# Patient Record
Sex: Female | Born: 1970
Health system: Southern US, Community
[De-identification: ages and names within clinical notes are randomized; demographics above are authoritative.]

## PROBLEM LIST (undated history)

## (undated) DIAGNOSIS — I10 Essential (primary) hypertension: Secondary | ICD-10-CM

## (undated) DIAGNOSIS — I7300 Raynaud's syndrome without gangrene: Secondary | ICD-10-CM

## (undated) DIAGNOSIS — E785 Hyperlipidemia, unspecified: Secondary | ICD-10-CM

## (undated) DIAGNOSIS — E079 Disorder of thyroid, unspecified: Secondary | ICD-10-CM

## (undated) HISTORY — DX: Raynaud's syndrome without gangrene: I73.00

## (undated) HISTORY — PX: MYOMECTOMY: SHX85

## (undated) HISTORY — DX: Hyperlipidemia, unspecified: E78.5

## (undated) HISTORY — DX: Essential (primary) hypertension: I10

## (undated) HISTORY — DX: Disorder of thyroid, unspecified: E07.9

---

## 1998-04-23 ENCOUNTER — Encounter: Admission: RE | Admit: 1998-04-23 | Discharge: 1998-07-22 | Payer: Self-pay | Admitting: *Deleted

## 1998-05-29 ENCOUNTER — Other Ambulatory Visit: Admission: RE | Admit: 1998-05-29 | Discharge: 1998-05-29 | Payer: Self-pay | Admitting: Obstetrics and Gynecology

## 1998-06-12 ENCOUNTER — Other Ambulatory Visit: Admission: RE | Admit: 1998-06-12 | Discharge: 1998-06-12 | Payer: Self-pay | Admitting: Obstetrics & Gynecology

## 1998-06-19 ENCOUNTER — Inpatient Hospital Stay (HOSPITAL_COMMUNITY): Admission: AD | Admit: 1998-06-19 | Discharge: 1998-06-21 | Payer: Self-pay | Admitting: *Deleted

## 1998-12-13 ENCOUNTER — Other Ambulatory Visit: Admission: RE | Admit: 1998-12-13 | Discharge: 1998-12-13 | Payer: Self-pay | Admitting: *Deleted

## 1999-12-11 ENCOUNTER — Other Ambulatory Visit: Admission: RE | Admit: 1999-12-11 | Discharge: 1999-12-11 | Payer: Self-pay | Admitting: *Deleted

## 2000-07-23 ENCOUNTER — Other Ambulatory Visit: Admission: RE | Admit: 2000-07-23 | Discharge: 2000-07-23 | Payer: Self-pay | Admitting: Obstetrics & Gynecology

## 2000-08-24 ENCOUNTER — Inpatient Hospital Stay (HOSPITAL_COMMUNITY): Admission: AD | Admit: 2000-08-24 | Discharge: 2000-08-24 | Payer: Self-pay | Admitting: Obstetrics and Gynecology

## 2000-08-24 ENCOUNTER — Encounter: Payer: Self-pay | Admitting: Obstetrics and Gynecology

## 2000-09-07 ENCOUNTER — Ambulatory Visit (HOSPITAL_COMMUNITY): Admission: RE | Admit: 2000-09-07 | Discharge: 2000-09-07 | Payer: Self-pay | Admitting: *Deleted

## 2000-09-07 ENCOUNTER — Encounter: Payer: Self-pay | Admitting: *Deleted

## 2000-09-24 ENCOUNTER — Encounter: Payer: Self-pay | Admitting: *Deleted

## 2000-09-24 ENCOUNTER — Ambulatory Visit (HOSPITAL_COMMUNITY): Admission: RE | Admit: 2000-09-24 | Discharge: 2000-09-24 | Payer: Self-pay | Admitting: *Deleted

## 2000-11-11 ENCOUNTER — Ambulatory Visit (HOSPITAL_COMMUNITY): Admission: RE | Admit: 2000-11-11 | Discharge: 2000-11-11 | Payer: Self-pay | Admitting: *Deleted

## 2000-11-11 ENCOUNTER — Encounter: Payer: Self-pay | Admitting: *Deleted

## 2001-09-29 ENCOUNTER — Other Ambulatory Visit: Admission: RE | Admit: 2001-09-29 | Discharge: 2001-09-30 | Payer: Self-pay | Admitting: *Deleted

## 2002-10-04 ENCOUNTER — Other Ambulatory Visit: Admission: RE | Admit: 2002-10-04 | Discharge: 2002-10-04 | Payer: Self-pay | Admitting: *Deleted

## 2003-12-25 ENCOUNTER — Other Ambulatory Visit: Admission: RE | Admit: 2003-12-25 | Discharge: 2003-12-25 | Payer: Self-pay | Admitting: Family Medicine

## 2004-05-08 ENCOUNTER — Encounter: Admission: RE | Admit: 2004-05-08 | Discharge: 2004-05-08 | Payer: Self-pay | Admitting: Family Medicine

## 2004-06-20 ENCOUNTER — Emergency Department (HOSPITAL_COMMUNITY): Admission: EM | Admit: 2004-06-20 | Discharge: 2004-06-20 | Payer: Self-pay | Admitting: Emergency Medicine

## 2004-10-13 ENCOUNTER — Ambulatory Visit: Payer: Self-pay | Admitting: Family Medicine

## 2005-05-04 ENCOUNTER — Other Ambulatory Visit: Admission: RE | Admit: 2005-05-04 | Discharge: 2005-05-04 | Payer: Self-pay | Admitting: Obstetrics & Gynecology

## 2005-07-08 ENCOUNTER — Ambulatory Visit: Payer: Self-pay | Admitting: Family Medicine

## 2006-11-30 ENCOUNTER — Ambulatory Visit: Payer: Self-pay | Admitting: Family Medicine

## 2006-11-30 LAB — CONVERTED CEMR LAB
TSH: 2.17 microintl units/mL (ref 0.35–5.50)
Total CK: 46 units/L (ref 7–177)

## 2008-04-26 ENCOUNTER — Ambulatory Visit: Payer: Self-pay | Admitting: Family Medicine

## 2008-04-27 LAB — CONVERTED CEMR LAB: TSH: 1.72 microintl units/mL (ref 0.35–5.50)

## 2009-07-17 ENCOUNTER — Ambulatory Visit: Payer: Self-pay | Admitting: Family Medicine

## 2009-07-17 DIAGNOSIS — R141 Gas pain: Secondary | ICD-10-CM | POA: Insufficient documentation

## 2009-07-17 DIAGNOSIS — R109 Unspecified abdominal pain: Secondary | ICD-10-CM | POA: Insufficient documentation

## 2009-07-17 DIAGNOSIS — R143 Flatulence: Secondary | ICD-10-CM

## 2009-07-17 DIAGNOSIS — R142 Eructation: Secondary | ICD-10-CM

## 2009-07-17 DIAGNOSIS — K219 Gastro-esophageal reflux disease without esophagitis: Secondary | ICD-10-CM

## 2009-07-18 ENCOUNTER — Telehealth (INDEPENDENT_AMBULATORY_CARE_PROVIDER_SITE_OTHER): Payer: Self-pay | Admitting: *Deleted

## 2009-07-18 LAB — CONVERTED CEMR LAB
ALT: 11 units/L (ref 0–35)
AST: 18 units/L (ref 0–37)
Albumin: 4.3 g/dL (ref 3.5–5.2)
Alkaline Phosphatase: 45 units/L (ref 39–117)
Amylase: 59 units/L (ref 27–131)
BUN: 9 mg/dL (ref 6–23)
Basophils Absolute: 0 10*3/uL (ref 0.0–0.1)
Basophils Relative: 0.1 % (ref 0.0–3.0)
Bilirubin, Direct: 0 mg/dL (ref 0.0–0.3)
CO2: 28 meq/L (ref 19–32)
Calcium: 9 mg/dL (ref 8.4–10.5)
Chloride: 104 meq/L (ref 96–112)
Creatinine, Ser: 0.8 mg/dL (ref 0.4–1.2)
Eosinophils Absolute: 0 10*3/uL (ref 0.0–0.7)
Eosinophils Relative: 0.5 % (ref 0.0–5.0)
GFR calc non Af Amer: 85.43 mL/min (ref >60)
Glucose, Bld: 71 mg/dL (ref 70–99)
HCT: 44.7 % (ref 36.0–46.0)
Hemoglobin: 14.9 g/dL (ref 12.0–15.0)
Lipase: 17 units/L (ref 11.0–59.0)
Lymphocytes Relative: 17 % (ref 12.0–46.0)
Lymphs Abs: 1.1 10*3/uL (ref 0.7–4.0)
MCHC: 33.4 g/dL (ref 30.0–36.0)
MCV: 92.9 fL (ref 78.0–100.0)
Monocytes Absolute: 0.4 10*3/uL (ref 0.1–1.0)
Monocytes Relative: 5.4 % (ref 3.0–12.0)
Neutro Abs: 5.2 10*3/uL (ref 1.4–7.7)
Neutrophils Relative %: 77 % (ref 43.0–77.0)
Platelets: 187 10*3/uL (ref 150.0–400.0)
Potassium: 4.4 meq/L (ref 3.5–5.1)
RBC: 4.81 M/uL (ref 3.87–5.11)
RDW: 12.4 % (ref 11.5–14.6)
Sed Rate: 3 mm/hr (ref 0–22)
Sodium: 139 meq/L (ref 135–145)
Total Bilirubin: 1.3 mg/dL — ABNORMAL HIGH (ref 0.3–1.2)
Total Protein: 7.3 g/dL (ref 6.0–8.3)
WBC: 6.7 10*3/uL (ref 4.5–10.5)

## 2009-08-09 ENCOUNTER — Ambulatory Visit: Payer: Self-pay | Admitting: Family Medicine

## 2009-08-09 DIAGNOSIS — E039 Hypothyroidism, unspecified: Secondary | ICD-10-CM | POA: Insufficient documentation

## 2009-08-12 LAB — CONVERTED CEMR LAB: TSH: 1.399 microintl units/mL (ref 0.350–4.500)

## 2010-02-28 ENCOUNTER — Encounter: Payer: Self-pay | Admitting: Family Medicine

## 2010-07-10 ENCOUNTER — Ambulatory Visit: Payer: Self-pay | Admitting: Family Medicine

## 2010-07-10 DIAGNOSIS — I10 Essential (primary) hypertension: Secondary | ICD-10-CM

## 2010-07-10 DIAGNOSIS — H43399 Other vitreous opacities, unspecified eye: Secondary | ICD-10-CM | POA: Insufficient documentation

## 2010-08-06 ENCOUNTER — Telehealth (INDEPENDENT_AMBULATORY_CARE_PROVIDER_SITE_OTHER): Payer: Self-pay | Admitting: *Deleted

## 2010-08-11 ENCOUNTER — Ambulatory Visit: Payer: Self-pay | Admitting: Family Medicine

## 2010-08-12 LAB — CONVERTED CEMR LAB
ALT: 18 units/L (ref 0–35)
AST: 20 units/L (ref 0–37)
Alkaline Phosphatase: 44 units/L (ref 39–117)
Bilirubin, Direct: 0.2 mg/dL (ref 0.0–0.3)
CO2: 29 meq/L (ref 19–32)
Chloride: 101 meq/L (ref 96–112)
Creatinine, Ser: 0.8 mg/dL (ref 0.4–1.2)
LDL Cholesterol: 71 mg/dL (ref 0–99)
Potassium: 4 meq/L (ref 3.5–5.1)
Sodium: 139 meq/L (ref 135–145)
Total Bilirubin: 0.8 mg/dL (ref 0.3–1.2)
Total CHOL/HDL Ratio: 2
Total Protein: 7 g/dL (ref 6.0–8.3)
Triglycerides: 25 mg/dL (ref 0.0–149.0)

## 2010-12-14 LAB — CONVERTED CEMR LAB
T3, Free: 2.8 pg/mL (ref 2.3–4.2)
TSH: 2.19 microintl units/mL (ref 0.35–5.50)

## 2010-12-18 NOTE — Assessment & Plan Note (Signed)
Summary: headaches/bp has been high/kn   Vital Signs:  Patient profile:   40 year old female Weight:      121 pounds Temp:     98.7 degrees F oral Pulse rate:   60 / minute Pulse rhythm:   regular BP sitting:   150 / 94  (left arm)  Vitals Entered By: Almeta Monas CMA Duncan Dull) (July 10, 2010 3:50 PM) CC: c/o elevated BP, seeing floaters and headaches x1wk  Vision Screening:Left eye w/o correction: 20 / 20 Right Eye w/o correction: 20 / 20 Both eyes w/o correction:  20/ 20       Vision Comments: per pt it is a little blurry  Vision Entered By: Almeta Monas CMA Duncan Dull) (July 10, 2010 4:23 PM)   History of Present Illness: Pt here c/o elevated bp for about a week with headache and has had floaters in both eyes on occassion.  NO CP, NO SOB.   Current Medications (verified): 1)  Levothyroxine Sodium 100 Mcg  Tabs (Levothyroxine Sodium) .... Take One Tablet Daily**labs Due Now** 2)  Camila 0.35 Mg Tabs (Norethindrone) .Marland Kitchen.. 1 By Mouth Qd 3)  Lisinopril-Hydrochlorothiazide 10-12.5 Mg Tabs (Lisinopril-Hydrochlorothiazide) .Marland Kitchen.. 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 mother- IBS Family History of CAD Female 1st degree relative <60 Family History of CAD Female 1st degree relative <50 Family History High  cholesterol Family History Hypertension  Social History: Reviewed history from 07/17/2009 and no changes required. 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   Neck:  No deformities, masses, or tenderness noted. Lungs:  Normal respiratory effort, chest expands symmetrically. Lungs are clear to auscultation, no crackles or wheezes. Heart:  normal rate and no murmur.   Extremities:  No clubbing, cyanosis, edema, or deformity noted with normal full range of motion of all joints.   Skin:  Intact without suspicious lesions or rashes Cervical Nodes:  No lymphadenopathy noted Psych:  Cognition and judgment appear intact. Alert and cooperative with normal attention span and concentration. No apparent delusions, illusions, hallucinations   Impression & Recommendations:  Problem # 1:  HYPERTENSION, ESSENTIAL NOS (ICD-401.9) Assessment New  Her updated medication list for this problem includes:    Lisinopril-hydrochlorothiazide 10-12.5 Mg Tabs (Lisinopril-hydrochlorothiazide) .Marland Kitchen... 1 by mouth once daily  BP today: 150/94 Prior BP: 94/60 (08/09/2009)  Labs Reviewed: K+: 4.4 (07/17/2009) Creat: : 0.8 (07/17/2009)     Problem # 2:  EYE FLOATERS (EAV-409.81)  Orders: Ophthalmology Referral (Ophthalmology) Vision Screening (19147)  Problem # 3:  HYPOTHYROIDISM (ICD-244.9)  Her updated medication list for this problem includes:    Levothyroxine Sodium 100 Mcg Tabs (Levothyroxine sodium) .Marland Kitchen... Take one tablet daily**labs due now**  Orders: Venipuncture (82956) TLB-TSH (Thyroid  Stimulating Hormone) (84443-TSH) TLB-T3, Free (Triiodothyronine) (84481-T3FREE) TLB-T4 (Thyrox), Free [phone]) Specimen Handling (08657)  Labs Reviewed: TSH:  1.399 (08/09/2009)     Complete Medication List: 1)  Levothyroxine Sodium 100 Mcg Tabs (Levothyroxine sodium) .... Take one tablet daily**labs due now** 2)  Camila 0.35 Mg Tabs (Norethindrone) .Marland Kitchen.. 1 by mouth qd 3)  Lisinopril-hydrochlorothiazide 10-12.5 Mg Tabs (Lisinopril-hydrochlorothiazide) .Marland Kitchen.. 1 by mouth once daily  Patient Instructions: 1)  Please schedule a follow-up appointment in 2 weeks. --  for bp check and fasting labs  Prescriptions: LISINOPRIL-HYDROCHLOROTHIAZIDE 10-12.5 MG TABS (LISINOPRIL-HYDROCHLOROTHIAZIDE) 1 by mouth once daily  #30 x 2   Entered and Authorized by:   Loreen Freud DO   Signed by:   Loreen Freud DO on 07/10/2010   Method used:   [facility]        [facility] [phone]* (retail)       [address] [place], [place]  [address]       Ph: [phone] or [phone]       Fax: [phone]   RxID:   [id number]    EKG  Procedure date:  07/10/2010  Findings:      Sinus bradycardia with rate of:  57

## 2010-12-18 NOTE — Assessment & Plan Note (Signed)
Summary: BP CHECK AND FASTING LABS--SEE 8/25 O/V--NO LABS LISTED//SPH   Vital Signs:  Patient profile:   40 year old female Height:      62 inches Weight:      118.4 pounds BMI:     21.73 Temp:     98.5 degrees F oral Pulse rate:   64 / minute Pulse rhythm:   regular BP sitting:   152 / 84  (left arm)  Vitals Entered By: Almeta Monas CMA Duncan Dull) (August 11, 2010 9:58 AM) CC: BP check, wants to discuss meds   History of Present Illness: Pt here for bp check.  visual changes have resolved.  No complaints.  Current Medications (verified): 1)  Levothyroxine Sodium 100 Mcg  Tabs (Levothyroxine Sodium) .... Take One Tablet Daily 2)  Camila 0.35 Mg Tabs (Norethindrone) .Marland Kitchen.. 1 By Mouth Qd 3)  Lisinopril-Hydrochlorothiazide 20-25 Mg Tabs (Lisinopril-Hydrochlorothiazide) .Marland Kitchen.. 1 By Mouth Once Daily  Allergies (verified): No Known Drug Allergies  Past History:  Past medical, surgical, family and social histories (including risk factors) reviewed for relevance to current acute and chronic problems.  Past Medical History: Reviewed history from 07/17/2009 and no changes required. hypothyroid  Past Surgical History: Reviewed history from 07/17/2009 and no changes required. Csection x2  Family History: Reviewed history from 07/10/2010 and no changes required. mother- IBS Family History of CAD Female 1st degree relative <60 Family History of CAD Female 1st degree relative <50 Family History High cholesterol Family History Hypertension  Social History: Reviewed history from 07/17/2009 and no changes required. 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 Lungs are clear to auscultation, no crackles or wheezes. Heart:  normal rate and no murmur.     Extremities:  No clubbing, cyanosis, edema, or deformity noted with normal full range of motion of all joints.   Psych:  Cognition and judgment appear intact. Alert and cooperative with normal attention span and concentration. No apparent delusions, illusions, hallucinations   Impression & Recommendations:  Problem # 1:  HYPERTENSION, ESSENTIAL NOS (ICD-401.9)  Her updated medication list for this problem includes:    Lisinopril-hydrochlorothiazide 20-25 Mg Tabs (Lisinopril-hydrochlorothiazide) .Marland Kitchen... 1 by mouth once daily  Orders: Venipuncture (04540) TLB-Lipid Panel (80061-LIPID) TLB-BMP (Basic Metabolic Panel-BMET) (80048-METABOL) TLB-Hepatic/Liver Function Pnl (80076-HEPATIC)  BP today: 152/84 Prior BP: 150/94 (07/10/2010)  Labs Reviewed: K+: 4.4 (07/17/2009) Creat: : 0.8 (07/17/2009)     Problem # 2:  HYPOTHYROIDISM (ICD-244.9)  Her updated medication list for this problem includes:    Levothyroxine Sodium 100 Mcg Tabs (Levothyroxine sodium) .Marland Kitchen... Take one tablet daily  Labs Reviewed: TSH: 2.19 (07/10/2010)     Complete Medication List: 1)  Levothyroxine Sodium 100 Mcg Tabs (Levothyroxine sodium) .... Take one tablet daily 2)  Camila 0.35 Mg Tabs (Norethindrone) .Marland Kitchen.. 1 by mouth qd 3)  Lisinopril-hydrochlorothiazide 20-25 Mg Tabs (Lisinopril-hydrochlorothiazide) .Marland Kitchen.. 1 by mouth once daily  Patient Instructions: 1)  Please schedule a follow-up appointment in 3 months .  Prescriptions: LEVOTHYROXINE SODIUM 100 MCG  TABS (LEVOTHYROXINE SODIUM) Take one tablet daily  #90 x 3   Entered and Authorized by:   Loreen Freud DO   Signed by:   Loreen Freud DO on 08/11/2010   Method used:   Electronically to        [facility] [phone]* (retail)  [address]       [place], [place]  [address]       Ph: [phone]       Fax: [phone]   RxID:   [id number] LISINOPRIL-HYDROCHLOROTHIAZIDE 20-25 MG TABS (LISINOPRIL-HYDROCHLOROTHIAZIDE) 1 by mouth once daily   #90 x 3   Entered and Authorized by:   Loreen Freud DO   Signed by:   Loreen Freud DO on 08/11/2010   Method used:   Electronically to        [facility] [phone]* (retail)       [address]       [place], [place]  [address]       Ph: [phone]       Fax: [phone]   RxID:   [id number] LISINOPRIL-HYDROCHLOROTHIAZIDE 20-25 MG TABS (LISINOPRIL-HYDROCHLOROTHIAZIDE) 1 by mouth once daily  #90 x 3   Entered and Authorized by:   Loreen Freud DO   Signed by:   Loreen Freud DO on 08/11/2010   Method used:   Electronically to        [facility] [phone]* (retail)       [address]       [place], [place]  [address]       Ph: [phone]       Fax: [phone]   RxID:   [id number]   Appended Document: BP CHECK AND FASTING LABS--SEE 8/25 O/V--NO LABS LISTED//SPH

## 2010-12-18 NOTE — Progress Notes (Signed)
Summary: NEED LAB ORDERS  Phone Note Call from Patient   Summary of Call: ORDERS AT BOTTOM OF 8/25 OFFICE VISIT STATE:  Please schedule a follow-up appointment in 2 weeks. --  for bp check and fasting labs   BUT NO LABS ARE LISTED--PATIENT HAS APPT FOR [facility] 9/26 AT 9:45---WHAT LABS DOES SHE NEED?? Initial call taken by: Jerolyn Shin,  August 06, 2010 1:33 PM  Follow-up for Phone Call        If pt has BP check appt the docotor or assistant will put order in after BP is checked. No need to request order prior to appt just make sure appt states BP check, and labs...............Marland KitchenFelecia Deloach CMA  August 06, 2010 1:42 PM   Additional Follow-up for Phone Call Additional follow up Details #1::        RECEIVED, WILL MAKE NOTE Additional Follow-up by: Jerolyn Shin,  August 06, 2010 3:05 PM

## 2010-12-18 NOTE — Letter (Signed)
[facility]   Im[facility] By: Lanelle Bal 03/06/2010 11:05:54  _____________________________________________________________________  External Attachment:    Type:   Image     Comment:   External Document

## 2011-11-23 ENCOUNTER — Ambulatory Visit (INDEPENDENT_AMBULATORY_CARE_PROVIDER_SITE_OTHER): Payer: BC Managed Care – PPO | Admitting: Family Medicine

## 2011-11-23 ENCOUNTER — Encounter: Payer: Self-pay | Admitting: Family Medicine

## 2011-11-23 VITALS — BP 116/60 | HR 76 | Temp 99.0°F | Wt 121.2 lb

## 2011-11-23 DIAGNOSIS — I10 Essential (primary) hypertension: Secondary | ICD-10-CM

## 2011-11-23 DIAGNOSIS — E039 Hypothyroidism, unspecified: Secondary | ICD-10-CM

## 2011-11-23 LAB — BASIC METABOLIC PANEL
BUN: 10 mg/dL (ref 6–23)
Creatinine, Ser: 0.8 mg/dL (ref 0.4–1.2)
GFR: 86.89 mL/min (ref >60.00)
Glucose, Bld: 93 mg/dL (ref 70–99)
Potassium: 3.9 mEq/L (ref 3.5–5.1)

## 2011-11-23 LAB — LIPID PANEL
Cholesterol: 156 mg/dL (ref 0–200)
Triglycerides: 24 mg/dL (ref 0.0–149.0)

## 2011-11-23 MED ORDER — LISINOPRIL 10 MG PO TABS: 10.0000 mg | ORAL_TABLET | Freq: Every day | ORAL | Status: DC

## 2011-11-23 MED ORDER — LEVOTHYROXINE SODIUM 100 MCG PO TABS: 100.0000 ug | ORAL_TABLET | Freq: Every day | ORAL | Status: DC

## 2011-11-23 NOTE — Patient Instructions (Addendum)

## 2011-11-23 NOTE — Progress Notes (Signed)
  Subjective:    Patient here for follow-up of elevated blood pressure.  She is exercising and is adherent to a low-salt diet.  Blood pressure is well controlled at home. Cardiac symptoms: none. Patient denies: chest pain, chest pressure/discomfort, claudication, dyspnea, exertional chest pressure/discomfort, fatigue, irregular heart beat, lower extremity edema, near-syncope, orthopnea, palpitations, paroxysmal nocturnal dyspnea, syncope and tachypnea. Cardiovascular risk factors: family history of premature cardiovascular disease and hypertension. Use of agents associated with hypertension: none. History of target organ damage: none.  The following portions of the patient's history were reviewed and updated as appropriate: allergies, current medications, past family history, past medical history, past social history, past surgical history and problem list.  Review of Systems As above    Objective:    BP 116/60  Pulse 76  Temp(Src) 99 F (37.2 C) (Oral)  Wt 121 lb 3.2 oz (54.976 kg)  SpO2 98% General appearance: alert, cooperative, appears stated age and no distress Throat: lips, mucosa, and tongue normal; teeth and gums normal Neck: no adenopathy, no carotid bruit, supple, symmetrical, trachea midline and thyroid not enlarged, symmetric, no tenderness/mass/nodules Lungs: clear to auscultation bilaterally Heart: regular rate and rhythm, S1, S2 normal, no murmur, click, rub or gallop Extremities: extremities normal, atraumatic, no cyanosis or edema    Assessment:    Hypertension, normal blood pressure . Evidence of target organ damage: none.   hypothyroid---check labs Plan:    Medication: decrease to lisinopril 10 mg-- pt was only taking 1/2 lisinopril 20/25. Dietary sodium restriction. Regular aerobic exercise. Check blood pressures 2-3  times weekly and record. Follow up: 6 months and as needed.

## 2011-11-24 LAB — HEPATIC FUNCTION PANEL
ALT: 18 U/L (ref 0–35)
Albumin: 4.4 g/dL (ref 3.5–5.2)
Total Protein: 7.6 g/dL (ref 6.0–8.3)

## 2011-11-30 ENCOUNTER — Encounter: Payer: Self-pay | Admitting: Family Medicine

## 2012-12-31 ENCOUNTER — Other Ambulatory Visit: Payer: Self-pay

## 2013-02-27 ENCOUNTER — Other Ambulatory Visit: Payer: Self-pay | Admitting: Family Medicine

## 2013-03-06 ENCOUNTER — Ambulatory Visit: Payer: BC Managed Care – PPO | Admitting: Family Medicine

## 2013-03-20 ENCOUNTER — Ambulatory Visit (INDEPENDENT_AMBULATORY_CARE_PROVIDER_SITE_OTHER): Payer: BC Managed Care – PPO | Admitting: Family Medicine

## 2013-03-20 ENCOUNTER — Encounter: Payer: Self-pay | Admitting: Family Medicine

## 2013-03-20 VITALS — BP 114/68 | HR 68 | Temp 98.9°F | Wt 119.2 lb

## 2013-03-20 DIAGNOSIS — I10 Essential (primary) hypertension: Secondary | ICD-10-CM

## 2013-03-20 DIAGNOSIS — E039 Hypothyroidism, unspecified: Secondary | ICD-10-CM

## 2013-03-20 NOTE — Progress Notes (Signed)
  Subjective:    Patient here for follow-up of elevated blood pressure.  She is exercising and is adherent to a low-salt diet.  Blood pressure is well controlled at home. Cardiac symptoms: none. Patient denies: chest pain, chest pressure/discomfort, claudication, dyspnea, exertional chest pressure/discomfort, fatigue, irregular heart beat, lower extremity edema, near-syncope, orthopnea, palpitations, paroxysmal nocturnal dyspnea, syncope and tachypnea. Cardiovascular risk factors: family history of premature cardiovascular disease and hypertension. Use of agents associated with hypertension: none. History of target organ damage: none.  The following portions of the patient's history were reviewed and updated as appropriate: allergies, current medications, past family history, past medical history, past social history, past surgical history and problem list.  Review of Systems Pertinent items are noted in HPI.     Objective:    BP 114/68  Pulse 68  Temp(Src) 98.9 F (37.2 C) (Oral)  Wt 119 lb 3.2 oz (54.069 kg)  BMI 21.8 kg/m2  SpO2 99% General appearance: alert, cooperative, appears stated age and no distress Throat: lips, mucosa, and tongue normal; teeth and gums normal Neck: no adenopathy, supple, symmetrical, trachea midline and thyroid not enlarged, symmetric, no tenderness/mass/nodules Lungs: clear to auscultation bilaterally Heart: S1, S2 normal Extremities: extremities normal, atraumatic, no cyanosis or edema    Assessment:    Hypertension, normal blood pressure . Evidence of target organ damage: none.    Plan:    Medication: no change. Dietary sodium restriction. Regular aerobic exercise. Check blood pressures 2-3 times weekly and record. Follow up: 6 months and as needed.

## 2013-03-20 NOTE — Assessment & Plan Note (Signed)
Check labs Refill meds  

## 2013-03-20 NOTE — Patient Instructions (Signed)

## 2013-03-20 NOTE — Assessment & Plan Note (Signed)
Stable Con' meds 

## 2013-03-21 LAB — LIPID PANEL
HDL: 61.5 mg/dL (ref >39.00)
Total CHOL/HDL Ratio: 2
VLDL: 5.4 mg/dL (ref 0.0–40.0)

## 2013-03-21 LAB — HEPATIC FUNCTION PANEL
ALT: 29 U/L (ref 0–35)
Total Bilirubin: 0.9 mg/dL (ref 0.3–1.2)

## 2013-03-21 LAB — BASIC METABOLIC PANEL
BUN: 8 mg/dL (ref 6–23)
Chloride: 102 mEq/L (ref 96–112)
Glucose, Bld: 87 mg/dL (ref 70–99)
Potassium: 4.1 mEq/L (ref 3.5–5.1)

## 2013-03-21 LAB — TSH: TSH: 1.85 u[IU]/mL (ref 0.35–5.50)

## 2013-07-13 ENCOUNTER — Other Ambulatory Visit: Payer: Self-pay | Admitting: Family Medicine

## 2013-09-21 ENCOUNTER — Other Ambulatory Visit: Payer: Self-pay

## 2013-11-23 ENCOUNTER — Telehealth: Payer: Self-pay | Admitting: Family Medicine

## 2013-11-24 DIAGNOSIS — R011 Cardiac murmur, unspecified: Secondary | ICD-10-CM | POA: Insufficient documentation

## 2013-11-24 DIAGNOSIS — I493 Ventricular premature depolarization: Secondary | ICD-10-CM | POA: Insufficient documentation

## 2013-11-24 DIAGNOSIS — I10 Essential (primary) hypertension: Secondary | ICD-10-CM | POA: Insufficient documentation

## 2013-11-27 ENCOUNTER — Ambulatory Visit: Payer: BC Managed Care – PPO | Admitting: Family Medicine

## 2014-01-25 ENCOUNTER — Encounter: Payer: Self-pay | Admitting: Family Medicine

## 2014-02-26 ENCOUNTER — Other Ambulatory Visit: Payer: Self-pay | Admitting: Family Medicine

## 2014-06-18 ENCOUNTER — Other Ambulatory Visit: Payer: Self-pay | Admitting: Family Medicine

## 2014-06-27 ENCOUNTER — Ambulatory Visit: Payer: BC Managed Care – PPO | Admitting: Family Medicine

## 2014-07-16 ENCOUNTER — Encounter: Payer: Self-pay | Admitting: Family Medicine

## 2014-07-16 ENCOUNTER — Ambulatory Visit (INDEPENDENT_AMBULATORY_CARE_PROVIDER_SITE_OTHER): Payer: BC Managed Care – PPO | Admitting: Family Medicine

## 2014-07-16 VITALS — BP 134/88 | HR 55 | Temp 99.2°F | Wt 126.3 lb

## 2014-07-16 DIAGNOSIS — E039 Hypothyroidism, unspecified: Secondary | ICD-10-CM

## 2014-07-16 DIAGNOSIS — I10 Essential (primary) hypertension: Secondary | ICD-10-CM

## 2014-07-16 MED ORDER — LISINOPRIL 10 MG PO TABS: ORAL_TABLET | ORAL | Status: DC

## 2014-07-16 MED ORDER — LEVOTHYROXINE SODIUM 100 MCG PO TABS: ORAL_TABLET | ORAL | Status: DC

## 2014-07-16 NOTE — Patient Instructions (Signed)

## 2014-07-16 NOTE — Progress Notes (Signed)
  Subjective:    Patient here for follow-up of elevated blood pressure.  She is exercising and is adherent to a low-salt diet.  Blood pressure is well controlled at home. Cardiac symptoms: none. Patient denies: chest pain, chest pressure/discomfort, claudication, dyspnea, exertional chest pressure/discomfort, fatigue, irregular heart beat, lower extremity edema, near-syncope, orthopnea, palpitations, paroxysmal nocturnal dyspnea, syncope and tachypnea. Cardiovascular risk factors: hypertension. Use of agents associated with hypertension: none. History of target organ damage: none.  The following portions of the patient's history were reviewed and updated as appropriate: allergies, current medications, past family history, past medical history, past social history, past surgical history and problem list.  Review of Systems Pertinent items are noted in HPI.     Objective:    BP 134/88  Pulse 55  Temp(Src) 99.2 F (37.3 C) (Oral)  Wt 126 lb 5.2 oz (57.3 kg)  SpO2 98%  LMP 07/02/2014 General appearance: alert, cooperative, appears stated age and no distress Throat: lips, mucosa, and tongue normal; teeth and gums normal Neck: no adenopathy, no carotid bruit, no JVD, supple, symmetrical, trachea midline and thyroid not enlarged, symmetric, no tenderness/mass/nodules Lungs: clear to auscultation bilaterally Heart: S1, S2 normal Extremities: extremities normal, atraumatic, no cyanosis or edema    Assessment:    Hypertension, slightly elevated today. Evidence of target organ damage: none.    Plan:    Medication: no change. Dietary sodium restriction. Regular aerobic exercise. Check blood pressures 2-3 times weekly and record. Follow up: 6 months and as needed.   1. Essential hypertension  - lisinopril (PRINIVIL,ZESTRIL) 10 MG tablet; TAKE ONE TABLET BY MOUTH ONE TIME DAILY  Dispense: 90 tablet; Refill: 1  2. Unspecified hypothyroidism  - levothyroxine (SYNTHROID, LEVOTHROID) 100  MCG tablet; TAKE ONE TABLET BY MOUTH ONE TIME DAILY  Dispense: 90 tablet; Refill: 1

## 2014-07-16 NOTE — Progress Notes (Signed)
Pre visit review using our clinic review tool, if applicable. No additional management support is needed unless otherwise documented below in the visit note. 

## 2015-01-15 ENCOUNTER — Ambulatory Visit: Payer: BC Managed Care – PPO | Admitting: Family Medicine

## 2015-01-21 ENCOUNTER — Ambulatory Visit: Payer: Self-pay | Admitting: Family Medicine

## 2015-01-31 ENCOUNTER — Ambulatory Visit: Payer: Self-pay | Admitting: Family Medicine

## 2015-02-19 ENCOUNTER — Ambulatory Visit (INDEPENDENT_AMBULATORY_CARE_PROVIDER_SITE_OTHER): Payer: BLUE CROSS/BLUE SHIELD | Admitting: Family Medicine

## 2015-02-19 ENCOUNTER — Encounter: Payer: Self-pay | Admitting: Family Medicine

## 2015-02-19 VITALS — BP 110/80 | HR 72 | Temp 99.0°F | Ht 62.0 in | Wt 131.6 lb

## 2015-02-19 DIAGNOSIS — E039 Hypothyroidism, unspecified: Secondary | ICD-10-CM

## 2015-02-19 DIAGNOSIS — I10 Essential (primary) hypertension: Secondary | ICD-10-CM

## 2015-02-19 DIAGNOSIS — I7300 Raynaud's syndrome without gangrene: Secondary | ICD-10-CM | POA: Diagnosis not present

## 2015-02-19 LAB — CBC WITH DIFFERENTIAL/PLATELET
BASOS PCT: 0.4 % (ref 0.0–3.0)
Basophils Absolute: 0 10*3/uL (ref 0.0–0.1)
Eosinophils Absolute: 0.1 10*3/uL (ref 0.0–0.7)
Eosinophils Relative: 1 % (ref 0.0–5.0)
HCT: 42.3 % (ref 36.0–46.0)
Hemoglobin: 14.2 g/dL (ref 12.0–15.0)
LYMPHS PCT: 24.1 % (ref 12.0–46.0)
Lymphs Abs: 1.3 10*3/uL (ref 0.7–4.0)
MCHC: 33.6 g/dL (ref 30.0–36.0)
MCV: 90.6 fl (ref 78.0–100.0)
Monocytes Absolute: 0.3 10*3/uL (ref 0.1–1.0)
Monocytes Relative: 6.3 % (ref 3.0–12.0)
NEUTROS PCT: 68.2 % (ref 43.0–77.0)
Neutro Abs: 3.8 10*3/uL (ref 1.4–7.7)
Platelets: 253 10*3/uL (ref 150.0–400.0)
RBC: 4.67 Mil/uL (ref 3.87–5.11)
RDW: 13.3 % (ref 11.5–15.5)
WBC: 5.5 10*3/uL (ref 4.0–10.5)

## 2015-02-19 LAB — LIPID PANEL
CHOL/HDL RATIO: 3
Cholesterol: 149 mg/dL (ref 0–200)
HDL: 59.4 mg/dL (ref >39.00)
LDL Cholesterol: 83 mg/dL (ref 0–99)
NonHDL: 89.6
TRIGLYCERIDES: 35 mg/dL (ref 0.0–149.0)
VLDL: 7 mg/dL (ref 0.0–40.0)

## 2015-02-19 LAB — BASIC METABOLIC PANEL
BUN: 8 mg/dL (ref 6–23)
CHLORIDE: 102 meq/L (ref 96–112)
CO2: 28 meq/L (ref 19–32)
CREATININE: 0.83 mg/dL (ref 0.40–1.20)
Calcium: 9.3 mg/dL (ref 8.4–10.5)
GFR: 79.61 mL/min (ref >60.00)
Glucose, Bld: 86 mg/dL (ref 70–99)
Potassium: 4.1 mEq/L (ref 3.5–5.1)
Sodium: 135 mEq/L (ref 135–145)

## 2015-02-19 LAB — HEPATIC FUNCTION PANEL
ALBUMIN: 4.2 g/dL (ref 3.5–5.2)
ALK PHOS: 55 U/L (ref 39–117)
ALT: 21 U/L (ref 0–35)
AST: 22 U/L (ref 0–37)
Bilirubin, Direct: 0.1 mg/dL (ref 0.0–0.3)
TOTAL PROTEIN: 7.5 g/dL (ref 6.0–8.3)
Total Bilirubin: 0.6 mg/dL (ref 0.2–1.2)

## 2015-02-19 LAB — TSH: TSH: 3.12 u[IU]/mL (ref 0.35–4.50)

## 2015-02-19 MED ORDER — LISINOPRIL 10 MG PO TABS: ORAL_TABLET | ORAL | Status: DC

## 2015-02-19 NOTE — Progress Notes (Signed)
Subjective:    Patient ID: Megan Escobar, female    DOB: January 16, 1971, 44 y.o.   MRN: [id number]  HPI  Patient here for f/u bp and thyroid.  Pt also states she has a hx of raynauds and it has been worsening over the last few years.    Past Medical History  Diagnosis Date  . Thyroid disease     Hypothyroid    Review of Systems  Constitutional: Negative for activity change, appetite change, fatigue and unexpected weight change.  Respiratory: Negative for cough and shortness of breath.   Cardiovascular: Negative for chest pain and palpitations.  Psychiatric/Behavioral: Negative for behavioral problems and dysphoric mood. The patient is not nervous/anxious.     Current Outpatient Prescriptions on File Prior to Visit  Medication Sig Dispense Refill  . CAMILA 0.35 MG tablet Take 1 tablet by mouth daily.     Marland Kitchen levothyroxine (SYNTHROID, LEVOTHROID) 100 MCG tablet TAKE ONE TABLET BY MOUTH ONE TIME DAILY 90 tablet 1   No current facility-administered medications on file prior to visit.       Objective:    Physical Exam  Constitutional: She is oriented to person, place, and time. She appears well-developed and well-nourished. No distress.  HENT:  Right Ear: External ear normal.  Left Ear: External ear normal.  Nose: Nose normal.  Mouth/Throat: Oropharynx is clear and moist.  Eyes: EOM are normal. Pupils are equal, round, and reactive to light.  Neck: Normal range of motion. Neck supple.  Cardiovascular: Normal rate, regular rhythm and normal heart sounds.   No murmur heard. Pulmonary/Chest: Effort normal and breath sounds normal. No respiratory distress. She has no wheezes. She has no rales. She exhibits no tenderness.  Neurological: She is alert and oriented to person, place, and time.  Psychiatric: She has a normal mood and affect. Her behavior is normal. Judgment and thought content normal.    BP 110/80 mmHg  Pulse 72  Temp(Src) 99 F (37.2 C) (Oral)  Ht 5\' 2"  (1.575  m)  Wt 131 lb 9.6 oz (59.693 kg)  BMI 24.06 kg/m2  SpO2 96%  LMP 02/05/2015 Wt Readings from Last 3 Encounters:  02/19/15 131 lb 9.6 oz (59.693 kg)  07/16/14 126 lb 5.2 oz (57.3 kg)  03/20/13 119 lb 3.2 oz (54.069 kg)     Lab Results  Component Value Date   WBC 6.7 07/17/2009   HGB 14.9 07/17/2009   HCT 44.7 07/17/2009   PLT 187.0 07/17/2009   GLUCOSE 87 03/20/2013   CHOL 136 03/20/2013   TRIG 27.0 03/20/2013   HDL 61.50 03/20/2013   LDLCALC 69 03/20/2013   ALT 29 03/20/2013   AST 24 03/20/2013   NA 136 03/20/2013   K 4.1 03/20/2013   CL 102 03/20/2013   CREATININE 0.8 03/20/2013   BUN 8 03/20/2013   CO2 27 03/20/2013   TSH 1.85 03/20/2013       Assessment & Plan:   Problem List Items Addressed This Visit    Raynaud's disease    Offered to change med to norvasc Pt said she would like to wait until winter to change it      Relevant Medications   lisinopril (PRINIVIL,ZESTRIL) tablet    Other Visit Diagnoses    Hypothyroidism, unspecified hypothyroidism type    -  Primary    Relevant Orders    TSH    Essential hypertension        Relevant Medications    lisinopril (PRINIVIL,ZESTRIL) tablet  Other Relevant Orders    Basic metabolic panel    CBC with Differential/Platelet    Hepatic function panel    Lipid panel    TSH       I am having Megan Escobar maintain her CAMILA, levothyroxine, and lisinopril.  Meds ordered this encounter  Medications  . lisinopril (PRINIVIL,ZESTRIL) 10 MG tablet    Sig: TAKE ONE TABLET BY MOUTH ONE TIME DAILY    Dispense:  90 tablet    Refill:  [place], DO

## 2015-02-19 NOTE — Progress Notes (Signed)
Pre visit review using our clinic review tool, if applicable. No additional management support is needed unless otherwise documented below in the visit note. 

## 2015-02-19 NOTE — Patient Instructions (Signed)

## 2015-02-19 NOTE — Assessment & Plan Note (Signed)
Offered to change med to norvasc Pt said she would like to wait until winter to change it

## 2015-04-25 ENCOUNTER — Telehealth: Payer: Self-pay | Admitting: Family Medicine

## 2015-04-25 ENCOUNTER — Other Ambulatory Visit: Payer: Self-pay | Admitting: Family Medicine

## 2015-04-25 DIAGNOSIS — I10 Essential (primary) hypertension: Secondary | ICD-10-CM

## 2015-04-25 DIAGNOSIS — E039 Hypothyroidism, unspecified: Secondary | ICD-10-CM

## 2015-04-25 MED ORDER — LISINOPRIL 10 MG PO TABS: ORAL_TABLET | ORAL | Status: DC

## 2015-04-25 MED ORDER — LEVOTHYROXINE SODIUM 100 MCG PO TABS: ORAL_TABLET | ORAL | Status: DC

## 2015-04-25 NOTE — Telephone Encounter (Signed)
Caller name: Cordelia Pen [facility] [place]. Can be reached: [phone]  Reason for call: Pt needing refill on levothyroxine (SYNTHROID, LEVOTHROID) 100 MCG tablet. Please send to pharmacy.

## 2015-04-25 NOTE — Telephone Encounter (Signed)
Rx faxed.    KP 

## 2015-08-22 ENCOUNTER — Ambulatory Visit: Payer: BLUE CROSS/BLUE SHIELD | Admitting: Family Medicine

## 2015-09-02 ENCOUNTER — Ambulatory Visit (INDEPENDENT_AMBULATORY_CARE_PROVIDER_SITE_OTHER): Payer: BLUE CROSS/BLUE SHIELD | Admitting: Family Medicine

## 2015-09-02 ENCOUNTER — Encounter: Payer: Self-pay | Admitting: Family Medicine

## 2015-09-02 VITALS — BP 112/76 | HR 70 | Temp 99.2°F | Wt 129.6 lb

## 2015-09-02 DIAGNOSIS — I7300 Raynaud's syndrome without gangrene: Secondary | ICD-10-CM | POA: Diagnosis not present

## 2015-09-02 DIAGNOSIS — I10 Essential (primary) hypertension: Secondary | ICD-10-CM

## 2015-09-02 DIAGNOSIS — E039 Hypothyroidism, unspecified: Secondary | ICD-10-CM | POA: Diagnosis not present

## 2015-09-02 MED ORDER — AMLODIPINE BESYLATE 5 MG PO TABS: 5.0000 mg | ORAL_TABLET | Freq: Every day | ORAL | Status: DC

## 2015-09-02 MED ORDER — LEVOTHYROXINE SODIUM 100 MCG PO TABS: ORAL_TABLET | ORAL | Status: DC

## 2015-09-02 NOTE — Progress Notes (Signed)
Pre visit review using our clinic review tool, if applicable. No additional management support is needed unless otherwise documented below in the visit note. 

## 2015-09-02 NOTE — Patient Instructions (Signed)

## 2015-09-02 NOTE — Assessment & Plan Note (Signed)
Check labs 

## 2015-09-02 NOTE — Progress Notes (Signed)
Patient ID: Megan Escobar, female    DOB: 1971/07/16  Age: 44 y.o. MRN: [id number]    Subjective:  Subjective HPI Megan Escobar presents for f/u raynauds and bp.  As weather cools down she will have more trouble with the raynauds and would like to switch her bp med.    We also need to check her thyroid.   Review of Systems  Constitutional: Negative for diaphoresis, appetite change, fatigue and unexpected weight change.  Eyes: Negative for pain, redness and visual disturbance.  Respiratory: Negative for cough, chest tightness, shortness of breath and wheezing.   Cardiovascular: Negative for chest pain, palpitations and leg swelling.  Endocrine: Negative for cold intolerance, heat intolerance, polydipsia, polyphagia and polyuria.  Genitourinary: Negative for dysuria, frequency and difficulty urinating.  Neurological: Negative for dizziness, light-headedness, numbness and headaches.  All other systems reviewed and are negative.   History Past Medical History  Diagnosis Date  . Thyroid disease     Hypothyroid    She has past surgical history that includes Cesarean section.   Her family history includes Coronary artery disease in her father and mother; Heart disease in her father; Heart disease (age of onset: 40) in her mother; Hyperlipidemia in her father and mother; Hypertension in her father and mother; Irritable bowel syndrome in her mother.She reports that she has never smoked. She has never used smokeless tobacco. She reports that she does not drink alcohol or use illicit drugs.  Current Outpatient Prescriptions on File Prior to Visit  Medication Sig Dispense Refill  . CAMILA 0.35 MG tablet Take 1 tablet by mouth daily.      No current facility-administered medications on file prior to visit.     Objective:  Objective Physical Exam  Constitutional: She is oriented to person, place, and time. She appears well-developed and well-nourished.  HENT:  Head: Normocephalic  and atraumatic.  Eyes: Conjunctivae and EOM are normal.  Neck: Normal range of motion. Neck supple. No JVD present. Carotid bruit is not present. No thyromegaly present.  Cardiovascular: Normal rate, regular rhythm and normal heart sounds.   No murmur heard. Pulmonary/Chest: Effort normal and breath sounds normal. No respiratory distress. She has no wheezes. She has no rales. She exhibits no tenderness.  Musculoskeletal: She exhibits no edema.  Neurological: She is alert and oriented to person, place, and time.  Psychiatric: She has a normal mood and affect.   BP 112/76 mmHg  Pulse 70  Temp(Src) 99.2 F (37.3 C) (Oral)  Wt 129 lb 9.6 oz (58.786 kg)  SpO2 98% Wt Readings from Last 3 Encounters:  09/02/15 129 lb 9.6 oz (58.786 kg)  02/19/15 131 lb 9.6 oz (59.693 kg)  07/16/14 126 lb 5.2 oz (57.3 kg)     Lab Results  Component Value Date   WBC 5.5 02/19/2015   HGB 14.2 02/19/2015   HCT 42.3 02/19/2015   PLT 253.0 02/19/2015   GLUCOSE 86 02/19/2015   CHOL 149 02/19/2015   TRIG 35.0 02/19/2015   HDL 59.40 02/19/2015   LDLCALC 83 02/19/2015   ALT 21 02/19/2015   AST 22 02/19/2015   NA 135 02/19/2015   K 4.1 02/19/2015   CL 102 02/19/2015   CREATININE 0.83 02/19/2015   BUN 8 02/19/2015   CO2 28 02/19/2015   TSH 3.12 02/19/2015    No results found.   Assessment & Plan:  Plan I have discontinued Megan Escobar's lisinopril. I am also having her start on amLODipine. Additionally, I am having  her maintain her CAMILA and levothyroxine.  Meds ordered this encounter  Medications  . levothyroxine (SYNTHROID, LEVOTHROID) 100 MCG tablet    Sig: TAKE ONE TABLET BY MOUTH ONE TIME DAILY    Dispense:  90 tablet    Refill:  1    D/C PREVIOUS SCRIPTS FOR THIS MEDICATION  . amLODipine (NORVASC) 5 MG tablet    Sig: Take 1 tablet (5 mg total) by mouth daily.    Dispense:  30 tablet    Refill:  2    Problem List Items Addressed This Visit    Raynaud's disease    Start norvasc  daily rto prn      Relevant Medications   amLODipine (NORVASC) 5 MG tablet   Hypothyroidism - Primary    Check labs      Relevant Medications   levothyroxine (SYNTHROID, LEVOTHROID) 100 MCG tablet   Essential hypertension    norvasc 5 mg daily rto 3 months or sooner prn      Relevant Medications   amLODipine (NORVASC) 5 MG tablet      Follow-up: Return in about 3 months (around 12/03/2015), or if symptoms worsen or fail to improve, for hypertension , raynauds.  Garnet Koyanagi, DO

## 2015-09-02 NOTE — Assessment & Plan Note (Signed)
norvasc 5 mg daily rto 3 months or sooner prn

## 2015-09-02 NOTE — Assessment & Plan Note (Signed)
Start norvasc daily rto prn

## 2015-12-03 ENCOUNTER — Ambulatory Visit: Payer: BLUE CROSS/BLUE SHIELD | Admitting: Family Medicine

## 2016-01-09 ENCOUNTER — Ambulatory Visit (INDEPENDENT_AMBULATORY_CARE_PROVIDER_SITE_OTHER): Payer: BLUE CROSS/BLUE SHIELD | Admitting: Family Medicine

## 2016-01-09 ENCOUNTER — Encounter: Payer: Self-pay | Admitting: Family Medicine

## 2016-01-09 VITALS — BP 120/66 | HR 70 | Temp 98.6°F | Wt 134.0 lb

## 2016-01-09 DIAGNOSIS — I10 Essential (primary) hypertension: Secondary | ICD-10-CM | POA: Diagnosis not present

## 2016-01-09 DIAGNOSIS — I7300 Raynaud's syndrome without gangrene: Secondary | ICD-10-CM | POA: Diagnosis not present

## 2016-01-09 MED ORDER — AMLODIPINE BESYLATE 2.5 MG PO TABS: 2.5000 mg | ORAL_TABLET | Freq: Every day | ORAL | Status: DC

## 2016-01-09 MED ORDER — LISINOPRIL 10 MG PO TABS: 10.0000 mg | ORAL_TABLET | Freq: Every day | ORAL | Status: DC

## 2016-01-09 NOTE — Assessment & Plan Note (Signed)
con't norvasc but will lower to 2. 5 and restart lisinopril for bp F/u prn

## 2016-01-09 NOTE — Progress Notes (Signed)
Patient ID: Megan Escobar, female    DOB: [birth year]  Age: 45 y.o. MRN: [id number]    Subjective:  Subjective HPI Megan Escobar presents for f/u bp.  It is running high at work 150-170/80s at work but is normal at home.  The lisinopril did not do that.  The norvasc is just for raynauds.    Review of Systems  Constitutional: Negative for diaphoresis, appetite change, fatigue and unexpected weight change.  Eyes: Negative for pain, redness and visual disturbance.  Respiratory: Negative for cough, chest tightness, shortness of breath and wheezing.   Cardiovascular: Negative for chest pain, palpitations and leg swelling.  Endocrine: Negative for cold intolerance, heat intolerance, polydipsia, polyphagia and polyuria.  Genitourinary: Negative for dysuria, frequency and difficulty urinating.  Neurological: Negative for dizziness, light-headedness, numbness and headaches.    History Past Medical History  Diagnosis Date  . Thyroid disease     Hypothyroid    She has past surgical history that includes Cesarean section.   Her family history includes Coronary artery disease in her father and mother; Heart disease in her father; Heart disease (age of onset: 62) in her mother; Hyperlipidemia in her father and mother; Hypertension in her father and mother; Irritable bowel syndrome in her mother.She reports that she has never smoked. She has never used smokeless tobacco. She reports that she does not drink alcohol or use illicit drugs.  Current Outpatient Prescriptions on File Prior to Visit  Medication Sig Dispense Refill  . CAMILA 0.35 MG tablet Take 1 tablet by mouth daily.     Marland Kitchen levothyroxine (SYNTHROID, LEVOTHROID) 100 MCG tablet TAKE ONE TABLET BY MOUTH ONE TIME DAILY 90 tablet 1   No current facility-administered medications on file prior to visit.     Objective:  Objective Physical Exam  Constitutional: She is oriented to person, place, and time. She appears well-developed and  well-nourished.  HENT:  Head: Normocephalic and atraumatic.  Eyes: Conjunctivae and EOM are normal.  Neck: Normal range of motion. Neck supple. No JVD present. Carotid bruit is not present. No thyromegaly present.  Cardiovascular: Normal rate, regular rhythm and normal heart sounds.   No murmur heard. Pulmonary/Chest: Effort normal and breath sounds normal. No respiratory distress. She has no wheezes. She has no rales. She exhibits no tenderness.  Musculoskeletal: She exhibits no edema.  Neurological: She is alert and oriented to person, place, and time.  Psychiatric: She has a normal mood and affect. Her behavior is normal. Judgment and thought content normal.  Nursing note and vitals reviewed.  BP 120/66 mmHg  Pulse 70  Temp(Src) 98.6 F (37 C) (Oral)  Wt 134 lb (60.782 kg)  SpO2 98% Wt Readings from Last 3 Encounters:  01/09/16 134 lb (60.782 kg)  09/02/15 129 lb 9.6 oz (58.786 kg)  02/19/15 131 lb 9.6 oz (59.693 kg)     Lab Results  Component Value Date   WBC 5.5 02/19/2015   HGB 14.2 02/19/2015   HCT 42.3 02/19/2015   PLT 253.0 02/19/2015   GLUCOSE 86 02/19/2015   CHOL 149 02/19/2015   TRIG 35.0 02/19/2015   HDL 59.40 02/19/2015   LDLCALC 83 02/19/2015   ALT 21 02/19/2015   AST 22 02/19/2015   NA 135 02/19/2015   K 4.1 02/19/2015   CL 102 02/19/2015   CREATININE 0.83 02/19/2015   BUN 8 02/19/2015   CO2 28 02/19/2015   TSH 3.12 02/19/2015    No results found.   Assessment & Plan:  Plan I have discontinued Ms. Erbes's amLODipine. I am also having her start on amLODipine and lisinopril. Additionally, I am having her maintain her CAMILA and levothyroxine.  Meds ordered this encounter  Medications  . amLODipine (NORVASC) 2.5 MG tablet    Sig: Take 1 tablet (2.5 mg total) by mouth daily.    Dispense:  90 tablet    Refill:  3  . lisinopril (PRINIVIL,ZESTRIL) 10 MG tablet    Sig: Take 1 tablet (10 mg total) by mouth daily.    Dispense:  90 tablet     Refill:  3    Problem List Items Addressed This Visit      Unprioritized   Raynaud's disease    con't norvasc but will lower to 2. 5 and restart lisinopril for bp F/u prn      Relevant Medications   amLODipine (NORVASC) 2.5 MG tablet   lisinopril (PRINIVIL,ZESTRIL) 10 MG tablet   Essential hypertension - Primary   Relevant Medications   amLODipine (NORVASC) 2.5 MG tablet   lisinopril (PRINIVIL,ZESTRIL) 10 MG tablet      Follow-up: Return in about 2 weeks (around 01/23/2016), or if symptoms worsen or fail to improve, for hypertension.  Garnet Koyanagi, DO

## 2016-01-09 NOTE — Progress Notes (Signed)
Pre visit review using our clinic review tool, if applicable. No additional management support is needed unless otherwise documented below in the visit note. 

## 2016-01-09 NOTE — Patient Instructions (Signed)
Hypertension Hypertension, commonly called high blood pressure, is when the force of blood pumping through your arteries is too strong. Your arteries are the blood vessels that carry blood from your heart throughout your body. A blood pressure reading consists of a higher number over a lower number, such as 110/72. The higher number (systolic) is the pressure inside your arteries when your heart pumps. The lower number (diastolic) is the pressure inside your arteries when your heart relaxes. Ideally you want your blood pressure below 120/80. Hypertension forces your heart to work harder to pump blood. Your arteries may become narrow or stiff. Having untreated or uncontrolled hypertension can cause heart attack, stroke, kidney disease, and other problems. RISK FACTORS Some risk factors for high blood pressure are controllable. Others are not.  Risk factors you cannot control include:   Race. You may be at higher risk if you are African American.  Age. Risk increases with age.  Gender. Men are at higher risk than women before age 45 years. After age 65, women are at higher risk than men. Risk factors you can control include:  Not getting enough exercise or physical activity.  Being overweight.  Getting too much fat, sugar, calories, or salt in your diet.  Drinking too much alcohol. SIGNS AND SYMPTOMS Hypertension does not usually cause signs or symptoms. Extremely high blood pressure (hypertensive crisis) may cause headache, anxiety, shortness of breath, and nosebleed. DIAGNOSIS To check if you have hypertension, your health care provider will measure your blood pressure while you are seated, with your arm held at the level of your heart. It should be measured at least twice using the same arm. Certain conditions can cause a difference in blood pressure between your right and left arms. A blood pressure reading that is higher than normal on one occasion does not mean that you need treatment. If  it is not clear whether you have high blood pressure, you may be asked to return on a different day to have your blood pressure checked again. Or, you may be asked to monitor your blood pressure at home for 1 or more weeks. TREATMENT Treating high blood pressure includes making lifestyle changes and possibly taking medicine. Living a healthy lifestyle can help lower high blood pressure. You may need to change some of your habits. Lifestyle changes may include:  Following the DASH diet. This diet is high in fruits, vegetables, and whole grains. It is low in salt, red meat, and added sugars.  Keep your sodium intake below 2,300 mg per day.  Getting at least 30-45 minutes of aerobic exercise at least 4 times per week.  Losing weight if necessary.  Not smoking.  Limiting alcoholic beverages.  Learning ways to reduce stress. Your health care provider may prescribe medicine if lifestyle changes are not enough to get your blood pressure under control, and if one of the following is true:  You are 18-59 years of age and your systolic blood pressure is above 140.  You are 60 years of age or older, and your systolic blood pressure is above 150.  Your diastolic blood pressure is above 90.  You have diabetes, and your systolic blood pressure is over 140 or your diastolic blood pressure is over 90.  You have kidney disease and your blood pressure is above 140/90.  You have heart disease and your blood pressure is above 140/90. Your personal target blood pressure may vary depending on your medical conditions, your age, and other factors. HOME CARE INSTRUCTIONS    Have your blood pressure rechecked as directed by your health care provider.   Take medicines only as directed by your health care provider. Follow the directions carefully. Blood pressure medicines must be taken as prescribed. The medicine does not work as well when you skip doses. Skipping doses also puts you at risk for  problems.  Do not smoke.   Monitor your blood pressure at home as directed by your health care provider. SEEK MEDICAL CARE IF:   You think you are having a reaction to medicines taken.  You have recurrent headaches or feel dizzy.  You have swelling in your ankles.  You have trouble with your vision. SEEK IMMEDIATE MEDICAL CARE IF:  You develop a severe headache or confusion.  You have unusual weakness, numbness, or feel faint.  You have severe chest or abdominal pain.  You vomit repeatedly.  You have trouble breathing. MAKE SURE YOU:   Understand these instructions.  Will watch your condition.  Will get help right away if you are not doing well or get worse.   This information is not intended to replace advice given to you by your health care provider. Make sure you discuss any questions you have with your health care provider.   Document Released: 11/02/2005 Document Revised: 03/19/2015 Document Reviewed: 08/25/2013 [employer] Interactive Patient Education 2016 [employer]  

## 2016-06-29 ENCOUNTER — Other Ambulatory Visit: Payer: Self-pay | Admitting: Family Medicine

## 2016-06-29 DIAGNOSIS — E039 Hypothyroidism, unspecified: Secondary | ICD-10-CM

## 2016-07-01 ENCOUNTER — Other Ambulatory Visit: Payer: Self-pay | Admitting: Family Medicine

## 2016-07-01 DIAGNOSIS — E039 Hypothyroidism, unspecified: Secondary | ICD-10-CM

## 2016-07-09 ENCOUNTER — Encounter: Payer: BLUE CROSS/BLUE SHIELD | Admitting: Family Medicine

## 2016-09-03 ENCOUNTER — Encounter: Payer: BLUE CROSS/BLUE SHIELD | Admitting: Family Medicine

## 2016-09-15 ENCOUNTER — Ambulatory Visit (INDEPENDENT_AMBULATORY_CARE_PROVIDER_SITE_OTHER): Payer: BLUE CROSS/BLUE SHIELD | Admitting: Family Medicine

## 2016-09-15 ENCOUNTER — Encounter: Payer: Self-pay | Admitting: Family Medicine

## 2016-09-15 VITALS — BP 118/68 | HR 55 | Temp 98.9°F | Resp 16 | Ht 62.0 in | Wt 133.4 lb

## 2016-09-15 DIAGNOSIS — Z0000 Encounter for general adult medical examination without abnormal findings: Secondary | ICD-10-CM

## 2016-09-15 DIAGNOSIS — Z8632 Personal history of gestational diabetes: Secondary | ICD-10-CM | POA: Diagnosis not present

## 2016-09-15 DIAGNOSIS — E785 Hyperlipidemia, unspecified: Secondary | ICD-10-CM | POA: Diagnosis not present

## 2016-09-15 DIAGNOSIS — I7300 Raynaud's syndrome without gangrene: Secondary | ICD-10-CM | POA: Diagnosis not present

## 2016-09-15 DIAGNOSIS — I10 Essential (primary) hypertension: Secondary | ICD-10-CM | POA: Diagnosis not present

## 2016-09-15 DIAGNOSIS — D229 Melanocytic nevi, unspecified: Secondary | ICD-10-CM

## 2016-09-15 LAB — COMPREHENSIVE METABOLIC PANEL
ALK PHOS: 61 U/L (ref 39–117)
ALT: 18 U/L (ref 0–35)
AST: 16 U/L (ref 0–37)
Albumin: 4.4 g/dL (ref 3.5–5.2)
BILIRUBIN TOTAL: 0.7 mg/dL (ref 0.2–1.2)
BUN: 7 mg/dL (ref 6–23)
CO2: 29 mEq/L (ref 19–32)
Calcium: 9.5 mg/dL (ref 8.4–10.5)
Chloride: 100 mEq/L (ref 96–112)
Creatinine, Ser: 0.82 mg/dL (ref 0.40–1.20)
GFR: 80.15 mL/min (ref >60.00)
GLUCOSE: 92 mg/dL (ref 70–99)
Potassium: 4.6 mEq/L (ref 3.5–5.1)
SODIUM: 136 meq/L (ref 135–145)
TOTAL PROTEIN: 7.7 g/dL (ref 6.0–8.3)

## 2016-09-15 LAB — CBC WITH DIFFERENTIAL/PLATELET
Basophils Absolute: 0 10*3/uL (ref 0.0–0.1)
Basophils Relative: 0.4 % (ref 0.0–3.0)
EOS ABS: 0 10*3/uL (ref 0.0–0.7)
EOS PCT: 0.5 % (ref 0.0–5.0)
HCT: 44.1 % (ref 36.0–46.0)
Hemoglobin: 14.9 g/dL (ref 12.0–15.0)
LYMPHS ABS: 1.9 10*3/uL (ref 0.7–4.0)
Lymphocytes Relative: 23.1 % (ref 12.0–46.0)
MCHC: 33.7 g/dL (ref 30.0–36.0)
MCV: 89.7 fl (ref 78.0–100.0)
MONO ABS: 0.5 10*3/uL (ref 0.1–1.0)
Monocytes Relative: 6.3 % (ref 3.0–12.0)
NEUTROS PCT: 69.7 % (ref 43.0–77.0)
Neutro Abs: 5.6 10*3/uL (ref 1.4–7.7)
Platelets: 273 10*3/uL (ref 150.0–400.0)
RBC: 4.92 Mil/uL (ref 3.87–5.11)
RDW: 12.8 % (ref 11.5–15.5)
WBC: 8 10*3/uL (ref 4.0–10.5)

## 2016-09-15 LAB — LIPID PANEL
CHOLESTEROL: 151 mg/dL (ref 0–200)
HDL: 64.9 mg/dL (ref >39.00)
LDL Cholesterol: 79 mg/dL (ref 0–99)
NonHDL: 86.17
TRIGLYCERIDES: 38 mg/dL (ref 0.0–149.0)
Total CHOL/HDL Ratio: 2
VLDL: 7.6 mg/dL (ref 0.0–40.0)

## 2016-09-15 LAB — HEMOGLOBIN A1C: Hgb A1c MFr Bld: 5.5 % (ref 4.6–6.5)

## 2016-09-15 LAB — TSH: TSH: 3.19 u[IU]/mL (ref 0.35–4.50)

## 2016-09-15 MED ORDER — AMLODIPINE BESYLATE 2.5 MG PO TABS: 2.5000 mg | ORAL_TABLET | Freq: Every day | ORAL | 3 refills | Status: DC

## 2016-09-15 NOTE — Patient Instructions (Signed)
Preventive Care for Adults, Female A healthy lifestyle and preventive care can promote health and wellness. Preventive health guidelines for women include the following key practices.  A routine yearly physical is a good way to check with your health care provider about your health and preventive screening. It is a chance to share any concerns and updates on your health and to receive a thorough exam.  Visit your dentist for a routine exam and preventive care every 6 months. Brush your teeth twice a day and floss once a day. Good oral hygiene prevents tooth decay and gum disease.  The frequency of eye exams is based on your age, health, family medical history, use of contact lenses, and other factors. Follow your health care provider's recommendations for frequency of eye exams.  Eat a healthy diet. Foods like vegetables, fruits, whole grains, low-fat dairy products, and lean protein foods contain the nutrients you need without too many calories. Decrease your intake of foods high in solid fats, added sugars, and salt. Eat the right amount of calories for you.Get information about a proper diet from your health care provider, if necessary.  Regular physical exercise is one of the most important things you can do for your health. Most adults should get at least 150 minutes of moderate-intensity exercise (any activity that increases your heart rate and causes you to sweat) each week. In addition, most adults need muscle-strengthening exercises on 2 or more days a week.  Maintain a healthy weight. The body mass index (BMI) is a screening tool to identify possible weight problems. It provides an estimate of body fat based on height and weight. Your health care provider can find your BMI and can help you achieve or maintain a healthy weight.For adults 20 years and older:  A BMI below 18.5 is considered underweight.  A BMI of 18.5 to 24.9 is normal.  A BMI of 25 to 29.9 is considered overweight.  A  BMI of 30 and above is considered obese.  Maintain normal blood lipids and cholesterol levels by exercising and minimizing your intake of saturated fat. Eat a balanced diet with plenty of fruit and vegetables. Blood tests for lipids and cholesterol should begin at age 45 and be repeated every 5 years. If your lipid or cholesterol levels are high, you are over 50, or you are at high risk for heart disease, you may need your cholesterol levels checked more frequently.Ongoing high lipid and cholesterol levels should be treated with medicines if diet and exercise are not working.  If you smoke, find out from your health care provider how to quit. If you do not use tobacco, do not start.  Lung cancer screening is recommended for adults aged 45-80 years who are at high risk for developing lung cancer because of a history of smoking. A yearly low-dose CT scan of the lungs is recommended for people who have at least a 30-pack-year history of smoking and are a current smoker or have quit within the past 15 years. A pack year of smoking is smoking an average of 1 pack of cigarettes a day for 1 year (for example: 1 pack a day for 30 years or 2 packs a day for 15 years). Yearly screening should continue until the smoker has stopped smoking for at least 15 years. Yearly screening should be stopped for people who develop a health problem that would prevent them from having lung cancer treatment.  If you are pregnant, do not drink alcohol. If you are  breastfeeding, be very cautious about drinking alcohol. If you are not pregnant and choose to drink alcohol, do not have more than 1 drink per day. One drink is considered to be 12 ounces (355 mL) of beer, 5 ounces (148 mL) of wine, or 1.5 ounces (44 mL) of liquor.  Avoid use of street drugs. Do not share needles with anyone. Ask for help if you need support or instructions about stopping the use of drugs.  High blood pressure causes heart disease and increases the risk  of stroke. Your blood pressure should be checked at least every 1 to 2 years. Ongoing high blood pressure should be treated with medicines if weight loss and exercise do not work.  If you are 55-79 years old, ask your health care provider if you should take aspirin to prevent strokes.  Diabetes screening is done by taking a blood sample to check your blood glucose level after you have not eaten for a certain period of time (fasting). If you are not overweight and you do not have risk factors for diabetes, you should be screened once every 3 years starting at age 45. If you are overweight or obese and you are 40-70 years of age, you should be screened for diabetes every year as part of your cardiovascular risk assessment.  Breast cancer screening is essential preventive care for women. You should practice "breast self-awareness." This means understanding the normal appearance and feel of your breasts and may include breast self-examination. Any changes detected, no matter how small, should be reported to a health care provider. Women in their 20s and 30s should have a clinical breast exam (CBE) by a health care provider as part of a regular health exam every 1 to 3 years. After age 40, women should have a CBE every year. Starting at age 40, women should consider having a mammogram (breast X-ray test) every year. Women who have a family history of breast cancer should talk to their health care provider about genetic screening. Women at a high risk of breast cancer should talk to their health care providers about having an MRI and a mammogram every year.  Breast cancer gene (BRCA)-related cancer risk assessment is recommended for women who have family members with BRCA-related cancers. BRCA-related cancers include breast, ovarian, tubal, and peritoneal cancers. Having family members with these cancers may be associated with an increased risk for harmful changes (mutations) in the breast cancer genes BRCA1 and  BRCA2. Results of the assessment will determine the need for genetic counseling and BRCA1 and BRCA2 testing.  Your health care provider may recommend that you be screened regularly for cancer of the pelvic organs (ovaries, uterus, and vagina). This screening involves a pelvic examination, including checking for microscopic changes to the surface of your cervix (Pap test). You may be encouraged to have this screening done every 3 years, beginning at age 21.  For women ages 30-65, health care providers may recommend pelvic exams and Pap testing every 3 years, or they may recommend the Pap and pelvic exam, combined with testing for human papilloma virus (HPV), every 5 years. Some types of HPV increase your risk of cervical cancer. Testing for HPV may also be done on women of any age with unclear Pap test results.  Other health care providers may not recommend any screening for nonpregnant women who are considered low risk for pelvic cancer and who do not have symptoms. Ask your health care provider if a screening pelvic exam is right for   you.  If you have had past treatment for cervical cancer or a condition that could lead to cancer, you need Pap tests and screening for cancer for at least 20 years after your treatment. If Pap tests have been discontinued, your risk factors (such as having a new sexual partner) need to be reassessed to determine if screening should resume. Some women have medical problems that increase the chance of getting cervical cancer. In these cases, your health care provider may recommend more frequent screening and Pap tests.  Colorectal cancer can be detected and often prevented. Most routine colorectal cancer screening begins at the age of 50 years and continues through age 75 years. However, your health care provider may recommend screening at an earlier age if you have risk factors for colon cancer. On a yearly basis, your health care provider may provide home test kits to check  for hidden blood in the stool. Use of a small camera at the end of a tube, to directly examine the colon (sigmoidoscopy or colonoscopy), can detect the earliest forms of colorectal cancer. Talk to your health care provider about this at age 50, when routine screening begins. Direct exam of the colon should be repeated every 5-10 years through age 75 years, unless early forms of precancerous polyps or small growths are found.  People who are at an increased risk for hepatitis B should be screened for this virus. You are considered at high risk for hepatitis B if:  You were born in a country where hepatitis B occurs often. Talk with your health care provider about which countries are considered high risk.  Your parents were born in a high-risk country and you have not received a shot to protect against hepatitis B (hepatitis B vaccine).  You have HIV or AIDS.  You use needles to inject street drugs.  You live with, or have sex with, someone who has hepatitis B.  You get hemodialysis treatment.  You take certain medicines for conditions like cancer, organ transplantation, and autoimmune conditions.  Hepatitis C blood testing is recommended for all people born from 1945 through 1965 and any individual with known risks for hepatitis C.  Practice safe sex. Use condoms and avoid high-risk sexual practices to reduce the spread of sexually transmitted infections (STIs). STIs include gonorrhea, chlamydia, syphilis, trichomonas, herpes, HPV, and human immunodeficiency virus (HIV). Herpes, HIV, and HPV are viral illnesses that have no cure. They can result in disability, cancer, and death.  You should be screened for sexually transmitted illnesses (STIs) including gonorrhea and chlamydia if:  You are sexually active and are younger than 24 years.  You are older than 24 years and your health care provider tells you that you are at risk for this type of infection.  Your sexual activity has changed  since you were last screened and you are at an increased risk for chlamydia or gonorrhea. Ask your health care provider if you are at risk.  If you are at risk of being infected with HIV, it is recommended that you take a prescription medicine daily to prevent HIV infection. This is called preexposure prophylaxis (PrEP). You are considered at risk if:  You are sexually active and do not regularly use condoms or know the HIV status of your partner(s).  You take drugs by injection.  You are sexually active with a partner who has HIV.  Talk with your health care provider about whether you are at high risk of being infected with HIV. If   you choose to begin PrEP, you should first be tested for HIV. You should then be tested every 3 months for as long as you are taking PrEP.  Osteoporosis is a disease in which the bones lose minerals and strength with aging. This can result in serious bone fractures or breaks. The risk of osteoporosis can be identified using a bone density scan. Women ages 67 years and over and women at risk for fractures or osteoporosis should discuss screening with their health care providers. Ask your health care provider whether you should take a calcium supplement or vitamin D to reduce the rate of osteoporosis.  Menopause can be associated with physical symptoms and risks. Hormone replacement therapy is available to decrease symptoms and risks. You should talk to your health care provider about whether hormone replacement therapy is right for you.  Use sunscreen. Apply sunscreen liberally and repeatedly throughout the day. You should seek shade when your shadow is shorter than you. Protect yourself by wearing long sleeves, pants, a wide-brimmed hat, and sunglasses year round, whenever you are outdoors.  Once a month, do a whole body skin exam, using a mirror to look at the skin on your back. Tell your health care provider of new moles, moles that have irregular borders, moles that  are larger than a pencil eraser, or moles that have changed in shape or color.  Stay current with required vaccines (immunizations).  Influenza vaccine. All adults should be immunized every year.  Tetanus, diphtheria, and acellular pertussis (Td, Tdap) vaccine. Pregnant women should receive 1 dose of Tdap vaccine during each pregnancy. The dose should be obtained regardless of the length of time since the last dose. Immunization is preferred during the 27th-36th week of gestation. An adult who has not previously received Tdap or who does not know her vaccine status should receive 1 dose of Tdap. This initial dose should be followed by tetanus and diphtheria toxoids (Td) booster doses every 10 years. Adults with an unknown or incomplete history of completing a 3-dose immunization series with Td-containing vaccines should begin or complete a primary immunization series including a Tdap dose. Adults should receive a Td booster every 10 years.  Varicella vaccine. An adult without evidence of immunity to varicella should receive 2 doses or a second dose if she has previously received 1 dose. Pregnant females who do not have evidence of immunity should receive the first dose after pregnancy. This first dose should be obtained before leaving the health care facility. The second dose should be obtained 4-8 weeks after the first dose.  Human papillomavirus (HPV) vaccine. Females aged 13-26 years who have not received the vaccine previously should obtain the 3-dose series. The vaccine is not recommended for use in pregnant females. However, pregnancy testing is not needed before receiving a dose. If a female is found to be pregnant after receiving a dose, no treatment is needed. In that case, the remaining doses should be delayed until after the pregnancy. Immunization is recommended for any person with an immunocompromised condition through the age of 61 years if she did not get any or all doses earlier. During the  3-dose series, the second dose should be obtained 4-8 weeks after the first dose. The third dose should be obtained 24 weeks after the first dose and 16 weeks after the second dose.  Zoster vaccine. One dose is recommended for adults aged 30 years or older unless certain conditions are present.  Measles, mumps, and rubella (MMR) vaccine. Adults born  before 1957 generally are considered immune to measles and mumps. Adults born in 1957 or later should have 1 or more doses of MMR vaccine unless there is a contraindication to the vaccine or there is laboratory evidence of immunity to each of the three diseases. A routine second dose of MMR vaccine should be obtained at least 28 days after the first dose for students attending postsecondary schools, health care workers, or international travelers. People who received inactivated measles vaccine or an unknown type of measles vaccine during 1963-1967 should receive 2 doses of MMR vaccine. People who received inactivated mumps vaccine or an unknown type of mumps vaccine before 1979 and are at high risk for mumps infection should consider immunization with 2 doses of MMR vaccine. For females of childbearing age, rubella immunity should be determined. If there is no evidence of immunity, females who are not pregnant should be vaccinated. If there is no evidence of immunity, females who are pregnant should delay immunization until after pregnancy. Unvaccinated health care workers born before 1957 who lack laboratory evidence of measles, mumps, or rubella immunity or laboratory confirmation of disease should consider measles and mumps immunization with 2 doses of MMR vaccine or rubella immunization with 1 dose of MMR vaccine.  Pneumococcal 13-valent conjugate (PCV13) vaccine. When indicated, a person who is uncertain of his immunization history and has no record of immunization should receive the PCV13 vaccine. All adults 65 years of age and older should receive this  vaccine. An adult aged 19 years or older who has certain medical conditions and has not been previously immunized should receive 1 dose of PCV13 vaccine. This PCV13 should be followed with a dose of pneumococcal polysaccharide (PPSV23) vaccine. Adults who are at high risk for pneumococcal disease should obtain the PPSV23 vaccine at least 8 weeks after the dose of PCV13 vaccine. Adults older than 45 years of age who have normal immune system function should obtain the PPSV23 vaccine dose at least 1 year after the dose of PCV13 vaccine.  Pneumococcal polysaccharide (PPSV23) vaccine. When PCV13 is also indicated, PCV13 should be obtained first. All adults aged 65 years and older should be immunized. An adult younger than age 65 years who has certain medical conditions should be immunized. Any person who resides in a nursing home or long-term care facility should be immunized. An adult smoker should be immunized. People with an immunocompromised condition and certain other conditions should receive both PCV13 and PPSV23 vaccines. People with human immunodeficiency virus (HIV) infection should be immunized as soon as possible after diagnosis. Immunization during chemotherapy or radiation therapy should be avoided. Routine use of PPSV23 vaccine is not recommended for American Indians, [place] Natives, or people younger than 65 years unless there are medical conditions that require PPSV23 vaccine. When indicated, people who have unknown immunization and have no record of immunization should receive PPSV23 vaccine. One-time revaccination 5 years after the first dose of PPSV23 is recommended for people aged 19-64 years who have chronic kidney failure, nephrotic syndrome, asplenia, or immunocompromised conditions. People who received 1-2 doses of PPSV23 before age 65 years should receive another dose of PPSV23 vaccine at age 65 years or later if at least 5 years have passed since the previous dose. Doses of PPSV23 are not  needed for people immunized with PPSV23 at or after age 65 years.  Meningococcal vaccine. Adults with asplenia or persistent complement component deficiencies should receive 2 doses of quadrivalent meningococcal conjugate (MenACWY-D) vaccine. The doses should be obtained   at least 2 months apart. Microbiologists working with certain meningococcal bacteria, [place] recruits, people at risk during an outbreak, and people who travel to or live in countries with a high rate of meningitis should be immunized. A first-year college student up through age 34 years who is living in a residence hall should receive a dose if she did not receive a dose on or after her 16th birthday. Adults who have certain high-risk conditions should receive one or more doses of vaccine.  Hepatitis A vaccine. Adults who wish to be protected from this disease, have certain high-risk conditions, work with hepatitis A-infected animals, work in hepatitis A research labs, or travel to or work in countries with a high rate of hepatitis A should be immunized. Adults who were previously unvaccinated and who anticipate close contact with an international adoptee during the first 60 days after arrival in the [place] from a country with a high rate of hepatitis A should be immunized.  Hepatitis B vaccine. Adults who wish to be protected from this disease, have certain high-risk conditions, may be exposed to blood or other infectious body fluids, are household contacts or sex partners of hepatitis B positive people, are clients or workers in certain care facilities, or travel to or work in countries with a high rate of hepatitis B should be immunized.  Haemophilus influenzae type b (Hib) vaccine. A previously unvaccinated person with asplenia or sickle cell disease or having a scheduled splenectomy should receive 1 dose of Hib vaccine. Regardless of previous immunization, a recipient of a hematopoietic stem cell transplant should receive a  3-dose series 6-12 months after her successful transplant. Hib vaccine is not recommended for adults with HIV infection. Preventive Services / Frequency Ages 35 to 4 years  Blood pressure check.** / Every 3-5 years.  Lipid and cholesterol check.** / Every 5 years beginning at age 60.  Clinical breast exam.** / Every 3 years for women in their 71s and 10s.  BRCA-related cancer risk assessment.** / For women who have family members with a BRCA-related cancer (breast, ovarian, tubal, or peritoneal cancers).  Pap test.** / Every 2 years from ages 76 through 26. Every 3 years starting at age 61 through age 76 or 93 with a history of 3 consecutive normal Pap tests.  HPV screening.** / Every 3 years from ages 37 through ages 60 to 51 with a history of 3 consecutive normal Pap tests.  Hepatitis C blood test.** / For any individual with known risks for hepatitis C.  Skin self-exam. / Monthly.  Influenza vaccine. / Every year.  Tetanus, diphtheria, and acellular pertussis (Tdap, Td) vaccine.** / Consult your health care provider. Pregnant women should receive 1 dose of Tdap vaccine during each pregnancy. 1 dose of Td every 10 years.  Varicella vaccine.** / Consult your health care provider. Pregnant females who do not have evidence of immunity should receive the first dose after pregnancy.  HPV vaccine. / 3 doses over 6 months, if 93 and younger. The vaccine is not recommended for use in pregnant females. However, pregnancy testing is not needed before receiving a dose.  Measles, mumps, rubella (MMR) vaccine.** / You need at least 1 dose of MMR if you were born in 1957 or later. You may also need a 2nd dose. For females of childbearing age, rubella immunity should be determined. If there is no evidence of immunity, females who are not pregnant should be vaccinated. If there is no evidence of immunity, females who are  pregnant should delay immunization until after pregnancy.  Pneumococcal  13-valent conjugate (PCV13) vaccine.** / Consult your health care provider.  Pneumococcal polysaccharide (PPSV23) vaccine.** / 1 to 2 doses if you smoke cigarettes or if you have certain conditions.  Meningococcal vaccine.** / 1 dose if you are age 68 to 8 years and a Market researcher living in a residence hall, or have one of several medical conditions, you need to get vaccinated against meningococcal disease. You may also need additional booster doses.  Hepatitis A vaccine.** / Consult your health care provider.  Hepatitis B vaccine.** / Consult your health care provider.  Haemophilus influenzae type b (Hib) vaccine.** / Consult your health care provider. Ages 7 to 53 years  Blood pressure check.** / Every year.  Lipid and cholesterol check.** / Every 5 years beginning at age 25 years.  Lung cancer screening. / Every year if you are aged 11-80 years and have a 30-pack-year history of smoking and currently smoke or have quit within the past 15 years. Yearly screening is stopped once you have quit smoking for at least 15 years or develop a health problem that would prevent you from having lung cancer treatment.  Clinical breast exam.** / Every year after age 48 years.  BRCA-related cancer risk assessment.** / For women who have family members with a BRCA-related cancer (breast, ovarian, tubal, or peritoneal cancers).  Mammogram.** / Every year beginning at age 41 years and continuing for as long as you are in good health. Consult with your health care provider.  Pap test.** / Every 3 years starting at age 65 years through age 37 or 70 years with a history of 3 consecutive normal Pap tests.  HPV screening.** / Every 3 years from ages 72 years through ages 60 to 40 years with a history of 3 consecutive normal Pap tests.  Fecal occult blood test (FOBT) of stool. / Every year beginning at age 21 years and continuing until age 5 years. You may not need to do this test if you get  a colonoscopy every 10 years.  Flexible sigmoidoscopy or colonoscopy.** / Every 5 years for a flexible sigmoidoscopy or every 10 years for a colonoscopy beginning at age 35 years and continuing until age 48 years.  Hepatitis C blood test.** / For all people born from [birth year] through 1965 and any individual with known risks for hepatitis C.  Skin self-exam. / Monthly.  Influenza vaccine. / Every year.  Tetanus, diphtheria, and acellular pertussis (Tdap/Td) vaccine.** / Consult your health care provider. Pregnant women should receive 1 dose of Tdap vaccine during each pregnancy. 1 dose of Td every 10 years.  Varicella vaccine.** / Consult your health care provider. Pregnant females who do not have evidence of immunity should receive the first dose after pregnancy.  Zoster vaccine.** / 1 dose for adults aged 30 years or older.  Measles, mumps, rubella (MMR) vaccine.** / You need at least 1 dose of MMR if you were born in 1957 or later. You may also need a second dose. For females of childbearing age, rubella immunity should be determined. If there is no evidence of immunity, females who are not pregnant should be vaccinated. If there is no evidence of immunity, females who are pregnant should delay immunization until after pregnancy.  Pneumococcal 13-valent conjugate (PCV13) vaccine.** / Consult your health care provider.  Pneumococcal polysaccharide (PPSV23) vaccine.** / 1 to 2 doses if you smoke cigarettes or if you have certain conditions.  Meningococcal vaccine.** /  Consult your health care provider.  Hepatitis A vaccine.** / Consult your health care provider.  Hepatitis B vaccine.** / Consult your health care provider.  Haemophilus influenzae type b (Hib) vaccine.** / Consult your health care provider. Ages 64 years and over  Blood pressure check.** / Every year.  Lipid and cholesterol check.** / Every 5 years beginning at age 23 years.  Lung cancer screening. / Every year if you  are aged 16-80 years and have a 30-pack-year history of smoking and currently smoke or have quit within the past 15 years. Yearly screening is stopped once you have quit smoking for at least 15 years or develop a health problem that would prevent you from having lung cancer treatment.  Clinical breast exam.** / Every year after age 74 years.  BRCA-related cancer risk assessment.** / For women who have family members with a BRCA-related cancer (breast, ovarian, tubal, or peritoneal cancers).  Mammogram.** / Every year beginning at age 44 years and continuing for as long as you are in good health. Consult with your health care provider.  Pap test.** / Every 3 years starting at age 58 years through age 22 or 39 years with 3 consecutive normal Pap tests. Testing can be stopped between 65 and 70 years with 3 consecutive normal Pap tests and no abnormal Pap or HPV tests in the past 10 years.  HPV screening.** / Every 3 years from ages 64 years through ages 70 or 61 years with a history of 3 consecutive normal Pap tests. Testing can be stopped between 65 and 70 years with 3 consecutive normal Pap tests and no abnormal Pap or HPV tests in the past 10 years.  Fecal occult blood test (FOBT) of stool. / Every year beginning at age 40 years and continuing until age 27 years. You may not need to do this test if you get a colonoscopy every 10 years.  Flexible sigmoidoscopy or colonoscopy.** / Every 5 years for a flexible sigmoidoscopy or every 10 years for a colonoscopy beginning at age 7 years and continuing until age 32 years.  Hepatitis C blood test.** / For all people born from [birth year] through 1965 and any individual with known risks for hepatitis C.  Osteoporosis screening.** / A one-time screening for women ages 30 years and over and women at risk for fractures or osteoporosis.  Skin self-exam. / Monthly.  Influenza vaccine. / Every year.  Tetanus, diphtheria, and acellular pertussis (Tdap/Td)  vaccine.** / 1 dose of Td every 10 years.  Varicella vaccine.** / Consult your health care provider.  Zoster vaccine.** / 1 dose for adults aged 35 years or older.  Pneumococcal 13-valent conjugate (PCV13) vaccine.** / Consult your health care provider.  Pneumococcal polysaccharide (PPSV23) vaccine.** / 1 dose for all adults aged 46 years and older.  Meningococcal vaccine.** / Consult your health care provider.  Hepatitis A vaccine.** / Consult your health care provider.  Hepatitis B vaccine.** / Consult your health care provider.  Haemophilus influenzae type b (Hib) vaccine.** / Consult your health care provider. ** Family history and personal history of risk and conditions may change your health care provider's recommendations.   This information is not intended to replace advice given to you by your health care provider. Make sure you discuss any questions you have with your health care provider.   Document Released: 12/29/2001 Document Revised: 11/23/2014 Document Reviewed: 03/30/2011 Elsevier Interactive Patient Education [employer].

## 2016-09-15 NOTE — Progress Notes (Signed)
Subjective:     Megan Escobar is a 45 y.o. female and is here for a comprehensive physical exam. The patient reports no problems. She is also here for f/u bp and cholesterol     Social History   Social History  . Marital status: Married    Spouse name: N/A  . Number of children: N/A  . Years of education: N/A   Occupational History  . rn [employer]   Social History Main Topics  . Smoking status: Never Smoker  . Smokeless tobacco: Never Used  . Alcohol use No  . Drug use: No  . Sexual activity: Yes   Other Topics Concern  . Not on file   Social History Narrative  . No narrative on file   Health Maintenance  Topic Date Due  . HIV Screening  09/15/2017 (Originally 10/10/1986)  . TETANUS/TDAP  11/22/2016  . MAMMOGRAM  04/29/2017  . PAP SMEAR  04/30/2019  . INFLUENZA VACCINE  Completed    The following portions of the patient's history were reviewed and updated as appropriate:  She  has a past medical history of HTN (hypertension); Hyperlipidemia; and Thyroid disease. She  does not have any pertinent problems on file. She  has a past surgical history that includes Cesarean section and Myomectomy. Her family history includes Coronary artery disease in her father and mother; Heart disease in her father; Heart disease (age of onset: 54) in her mother; Hyperlipidemia in her father and mother; Hypertension in her father and mother; Irritable bowel syndrome in her mother. She  reports that she has never smoked. She has never used smokeless tobacco. She reports that she does not drink alcohol or use drugs. She has a current medication list which includes the following prescription(s): camila, levothyroxine, lisinopril, and amlodipine. Current Outpatient Prescriptions on File Prior to Visit  Medication Sig Dispense Refill  . CAMILA 0.35 MG tablet Take 1 tablet by mouth daily.     Marland Kitchen levothyroxine (SYNTHROID, LEVOTHROID) 100 MCG tablet Take 1 tablet (100 mcg  total) by mouth daily. Repeat labs are due now 30 tablet 0  . lisinopril (PRINIVIL,ZESTRIL) 10 MG tablet Take 1 tablet (10 mg total) by mouth daily. 90 tablet 3   No current facility-administered medications on file prior to visit.    She has No Known Allergies..  Review of Systems Review of Systems  Constitutional: Negative for activity change, appetite change and fatigue.  HENT: Negative for hearing loss, congestion, tinnitus and ear discharge.  dentist q75m Eyes: Negative for visual disturbance (see optho q1y -- vision corrected to 20/20 with glasses).  Respiratory: Negative for cough, chest tightness and shortness of breath.   Cardiovascular: Negative for chest pain, palpitations and leg swelling.  Gastrointestinal: Negative for abdominal pain, diarrhea, constipation and abdominal distention.  Genitourinary: Negative for urgency, frequency, decreased urine volume and difficulty urinating.  Musculoskeletal: Negative for back pain, arthralgias and gait problem.  Skin: Negative for color change, pallor and rash. =+ mole scalp Neurological: Negative for dizziness, light-headedness, numbness and headaches.  Hematological: Negative for adenopathy. Does not bruise/bleed easily.  Psychiatric/Behavioral: Negative for suicidal ideas, confusion, sleep disturbance, self-injury, dysphoric mood, decreased concentration and agitation.       Objective:    BP 118/68 (BP Location: Left Arm, Patient Position: Sitting, Cuff Size: Normal)   Pulse (!) 55   Temp 98.9 F (37.2 C) (Oral)   Resp 16   Ht 5\' 2"  (1.575 m)   Wt 133 lb 6.4  oz (60.5 kg)   SpO2 96%   BMI 24.40 kg/m  General appearance: alert, cooperative, appears stated age and no distress Head: Normocephalic, without obvious abnormality, atraumatichtn  Eyes: conjunctivae/corneas clear. PERRL, EOM's intact. Fundi benign. Ears: normal TM's and external ear canals both ears Nose: Nares normal. Septum midline. Mucosa normal. No drainage or  sinus tenderness. Throat: lips, mucosa, and tongue normal; teeth and gums normal Neck: no adenopathy, no carotid bruit, no JVD, supple, symmetrical, trachea midline and thyroid not enlarged, symmetric, no tenderness/mass/nodules Back: symmetric, no curvature. ROM normal. No CVA tenderness. Lungs: clear to auscultation bilaterally Breasts: gyn Heart: regular rate and rhythm, S1, S2 normal, no murmur, click, rub or gallop Abdomen: soft, non-tender; bowel sounds normal; no masses,  no organomegaly Pelvic: deferred Extremities: gyn Pulses: 2+ and symmetric Skin: Skin color, texture, turgor normal. No rashes or lesions Lymph nodes: Cervical, supraclavicular, and axillary nodes normal. Neurologic: Alert and oriented X 3, normal strength and tone. Normal symmetric reflexes. Normal coordination and gait    Assessment:    Healthy female exam.      Plan:    ghm utd Check labs See After Visit Summary for Counseling Recommendations    1. Essential hypertension stable - amLODipine (NORVASC) 2.5 MG tablet; Take 1 tablet (2.5 mg total) by mouth daily.  Dispense: 90 tablet; Refill: 3 - CBC with Differential/Platelet - Lipid panel - POCT urinalysis dipstick - TSH - Comprehensive metabolic panel  2. Raynaud's disease without gangrene  - amLODipine (NORVASC) 2.5 MG tablet; Take 1 tablet (2.5 mg total) by mouth daily.  Dispense: 90 tablet; Refill: 3  3. Preventative health care See above - CBC with Differential/Platelet - Lipid panel - POCT urinalysis dipstick - TSH - Comprehensive metabolic panel  4. Hyperlipidemia, unspecified hyperlipidemia type Check labs - CBC with Differential/Platelet - Lipid panel - POCT urinalysis dipstick - TSH - Comprehensive metabolic panel  5. Suspicious nevus On scalp--- pt requesting derm referral - Ambulatory referral to Dermatology  6. History of gestational diabetes  - Hemoglobin A1c

## 2016-09-15 NOTE — Progress Notes (Signed)
Pre visit review using our clinic review tool, if applicable. No additional management support is needed unless otherwise documented below in the visit note. 

## 2016-10-06 ENCOUNTER — Other Ambulatory Visit: Payer: Self-pay | Admitting: Family Medicine

## 2016-10-06 DIAGNOSIS — E039 Hypothyroidism, unspecified: Secondary | ICD-10-CM

## 2017-01-28 ENCOUNTER — Ambulatory Visit (INDEPENDENT_AMBULATORY_CARE_PROVIDER_SITE_OTHER): Payer: BLUE CROSS/BLUE SHIELD | Admitting: Family Medicine

## 2017-01-28 ENCOUNTER — Encounter: Payer: Self-pay | Admitting: Family Medicine

## 2017-01-28 VITALS — BP 122/80 | HR 72 | Temp 98.2°F | Resp 16 | Ht 62.0 in | Wt 137.8 lb

## 2017-01-28 DIAGNOSIS — E039 Hypothyroidism, unspecified: Secondary | ICD-10-CM

## 2017-01-28 DIAGNOSIS — R195 Other fecal abnormalities: Secondary | ICD-10-CM | POA: Diagnosis not present

## 2017-01-28 DIAGNOSIS — R197 Diarrhea, unspecified: Secondary | ICD-10-CM | POA: Diagnosis not present

## 2017-01-28 LAB — CBC
HEMATOCRIT: 42.9 % (ref 36.0–46.0)
HEMOGLOBIN: 14.4 g/dL (ref 12.0–15.0)
MCHC: 33.7 g/dL (ref 30.0–36.0)
MCV: 91.4 fl (ref 78.0–100.0)
PLATELETS: 291 10*3/uL (ref 150.0–400.0)
RBC: 4.7 Mil/uL (ref 3.87–5.11)
RDW: 13.5 % (ref 11.5–15.5)
WBC: 6.5 10*3/uL (ref 4.0–10.5)

## 2017-01-28 LAB — COMPREHENSIVE METABOLIC PANEL
ALT: 25 U/L (ref 0–35)
AST: 21 U/L (ref 0–37)
Albumin: 4.3 g/dL (ref 3.5–5.2)
Alkaline Phosphatase: 58 U/L (ref 39–117)
BUN: 8 mg/dL (ref 6–23)
CALCIUM: 9.6 mg/dL (ref 8.4–10.5)
CHLORIDE: 101 meq/L (ref 96–112)
CO2: 27 meq/L (ref 19–32)
Creatinine, Ser: 0.77 mg/dL (ref 0.40–1.20)
GFR: 86.04 mL/min (ref >60.00)
Glucose, Bld: 114 mg/dL — ABNORMAL HIGH (ref 70–99)
Potassium: 3.8 mEq/L (ref 3.5–5.1)
Sodium: 135 mEq/L (ref 135–145)
Total Bilirubin: 0.4 mg/dL (ref 0.2–1.2)
Total Protein: 7.6 g/dL (ref 6.0–8.3)

## 2017-01-28 MED ORDER — LUBIPROSTONE 8 MCG PO CAPS: 8.0000 ug | ORAL_CAPSULE | Freq: Two times a day (BID) | ORAL | 1 refills | Status: DC

## 2017-01-28 MED ORDER — HYOSCYAMINE SULFATE 0.125 MG SL SUBL: 0.1250 mg | SUBLINGUAL_TABLET | SUBLINGUAL | Status: AC | PRN

## 2017-01-28 MED ORDER — HYOSCYAMINE SULFATE SL 0.125 MG SL SUBL: 1.0000 | SUBLINGUAL_TABLET | SUBLINGUAL | 0 refills | Status: DC | PRN

## 2017-01-28 NOTE — Telephone Encounter (Signed)
levsin sl 0.125 mg q4h prn #30

## 2017-01-28 NOTE — Assessment & Plan Note (Addendum)
f/u  With GI Check labs including tsh

## 2017-01-28 NOTE — Assessment & Plan Note (Signed)
Check labs due to loose stools con't meds

## 2017-01-28 NOTE — Addendum Note (Signed)
Addended by: Roma Schanz R on: 01/28/2017 03:36 PM   Modules accepted: Orders

## 2017-01-28 NOTE — Progress Notes (Signed)
Pre visit review using our clinic review tool, if applicable. No additional management support is needed unless otherwise documented below in the visit note. 

## 2017-01-28 NOTE — Progress Notes (Addendum)
Subjective:  I acted as a Education administrator for Dr. Royden Purl, LPN    Patient ID: Billey Co, female    DOB: 31-Jan-1971, 46 y.o.   MRN: [id number]  Chief Complaint  Patient presents with  . Diarrhea    Diarrhea   This is a recurrent problem. The current episode started more than 1 year ago. The problem has been gradually worsening. Pertinent negatives include no coughing, fever, headaches or vomiting.    Patient is in today for c/o loose stools off and on for about 1 year and it's getting worse. Patient report every time she eat she have to have an loose bowel movement.  Patient Care Team: Ann Held, DO as PCP - General   Past Medical History:  Diagnosis Date  . HTN (hypertension)   . Hyperlipidemia   . Thyroid disease    Hypothyroid    Past Surgical History:  Procedure Laterality Date  . CESAREAN SECTION     x's 2  . MYOMECTOMY      Family History  Problem Relation Age of Onset  . Irritable bowel syndrome Mother   . Heart disease Mother 31    cabg  . Hypertension Mother   . Hyperlipidemia Mother   . Coronary artery disease Mother   . Heart disease Father     stent  . Hypertension Father   . Hyperlipidemia Father   . Coronary artery disease Father     Social History   Social History  . Marital status: Married    Spouse name: N/A  . Number of children: N/A  . Years of education: N/A   Occupational History  . rn [employer]   Social History Main Topics  . Smoking status: Never Smoker  . Smokeless tobacco: Never Used  . Alcohol use No  . Drug use: No  . Sexual activity: Yes   Other Topics Concern  . Not on file   Social History Narrative  . No narrative on file    Outpatient Medications Prior to Visit  Medication Sig Dispense Refill  . amLODipine (NORVASC) 2.5 MG tablet Take 1 tablet (2.5 mg total) by mouth daily. 90 tablet 3  . CAMILA 0.35 MG tablet Take 1 tablet by mouth daily.     Marland Kitchen levothyroxine (SYNTHROID,  LEVOTHROID) 100 MCG tablet Take 1 tablet (100 mcg total) by mouth daily. Repeat labs are due now 30 tablet 0  . levothyroxine (SYNTHROID, LEVOTHROID) 100 MCG tablet TAKE ONE TABLET BY MOUTH ONCE DAILY 90 tablet 0  . lisinopril (PRINIVIL,ZESTRIL) 10 MG tablet Take 1 tablet (10 mg total) by mouth daily. 90 tablet 3   No facility-administered medications prior to visit.     No Known Allergies  Review of Systems  Constitutional: Negative for fever.  HENT: Negative for congestion.   Eyes: Negative for blurred vision.  Respiratory: Negative for cough.   Cardiovascular: Negative for chest pain and palpitations.  Gastrointestinal: Positive for diarrhea. Negative for vomiting.  Musculoskeletal: Negative for back pain.  Skin: Negative for rash.  Neurological: Negative for loss of consciousness and headaches.       Objective:    Physical Exam  Constitutional: She is oriented to person, place, and time. She appears well-developed and well-nourished. No distress.  HENT:  Head: Normocephalic and atraumatic.  Eyes: Conjunctivae are normal. Pupils are equal, round, and reactive to light.  Neck: Normal range of motion. No thyromegaly present.  Cardiovascular: Normal rate and regular rhythm.  Pulmonary/Chest: Effort normal and breath sounds normal. She has no wheezes.  Abdominal: Soft. Bowel sounds are normal. There is no tenderness.  Musculoskeletal: Normal range of motion. She exhibits no edema or deformity.  Neurological: She is alert and oriented to person, place, and time.  Skin: Skin is warm and dry. She is not diaphoretic.  Psychiatric: She has a normal mood and affect.    BP 122/80 (BP Location: Right Arm, Patient Position: Sitting, Cuff Size: Normal)   Pulse 72   Temp 98.2 F (36.8 C) (Oral)   Resp 16   Ht 5\' 2"  (1.575 m)   Wt 137 lb 12.8 oz (62.5 kg)   SpO2 97%   BMI 25.20 kg/m  Wt Readings from Last 3 Encounters:  01/28/17 137 lb 12.8 oz (62.5 kg)  09/15/16 133 lb 6.4 oz  (60.5 kg)  01/09/16 134 lb (60.8 kg)      Immunization History  Administered Date(s) Administered  . Influenza-Unspecified 08/17/2011, 08/26/2015, 09/08/2016  . Tdap 11/22/2006    Health Maintenance  Topic Date Due  . TETANUS/TDAP  11/22/2016  . HIV Screening  09/15/2017 (Originally 10/10/1986)  . MAMMOGRAM  04/29/2017  . PAP SMEAR  04/30/2019  . INFLUENZA VACCINE  Completed    Lab Results  Component Value Date   WBC 6.5 01/28/2017   HGB 14.4 01/28/2017   HCT 42.9 01/28/2017   PLT 291.0 01/28/2017   GLUCOSE 114 (H) 01/28/2017   CHOL 151 09/15/2016   TRIG 38.0 09/15/2016   HDL 64.90 09/15/2016   LDLCALC 79 09/15/2016   ALT 25 01/28/2017   AST 21 01/28/2017   NA 135 01/28/2017   K 3.8 01/28/2017   CL 101 01/28/2017   CREATININE 0.77 01/28/2017   BUN 8 01/28/2017   CO2 27 01/28/2017   TSH 1.350 01/28/2017   HGBA1C 5.5 09/15/2016    Lab Results  Component Value Date   TSH 1.350 01/28/2017   Lab Results  Component Value Date   WBC 6.5 01/28/2017   HGB 14.4 01/28/2017   HCT 42.9 01/28/2017   MCV 91.4 01/28/2017   PLT 291.0 01/28/2017   Lab Results  Component Value Date   NA 135 01/28/2017   K 3.8 01/28/2017   CO2 27 01/28/2017   GLUCOSE 114 (H) 01/28/2017   BUN 8 01/28/2017   CREATININE 0.77 01/28/2017   BILITOT 0.4 01/28/2017   ALKPHOS 58 01/28/2017   AST 21 01/28/2017   ALT 25 01/28/2017   PROT 7.6 01/28/2017   ALBUMIN 4.3 01/28/2017   CALCIUM 9.6 01/28/2017   GFR 86.04 01/28/2017   Lab Results  Component Value Date   CHOL 151 09/15/2016   Lab Results  Component Value Date   HDL 64.90 09/15/2016   Lab Results  Component Value Date   LDLCALC 79 09/15/2016   Lab Results  Component Value Date   TRIG 38.0 09/15/2016   Lab Results  Component Value Date   CHOLHDL 2 09/15/2016   Lab Results  Component Value Date   HGBA1C 5.5 09/15/2016         Assessment & Plan:   Problem List Items Addressed This Visit      Unprioritized    Hypothyroidism    Check labs due to loose stools con't meds      Loose stools    f/u  With GI Check labs including tsh      Relevant Medications   hyoscyamine (LEVSIN SL) SL tablet 0.125 mg    Other Visit  Diagnoses    Diarrhea, unspecified type    -  Primary   Relevant Medications   hyoscyamine (LEVSIN SL) SL tablet 0.125 mg   Other Relevant Orders   TSH+T4F+T3Free (Completed)   CBC (Completed)   Comprehensive metabolic panel (Completed)   Ambulatory referral to Gastroenterology      I have discontinued Ms. Lall's lubiprostone. I am also having her maintain her CAMILA, lisinopril, levothyroxine, amLODipine, and levothyroxine. We will continue to administer hyoscyamine.  Meds ordered this encounter  Medications  . DISCONTD: lubiprostone (AMITIZA) 8 MCG capsule    Sig: Take 1 capsule (8 mcg total) by mouth 2 (two) times daily with a meal.    Dispense:  30 capsule    Refill:  1  . hyoscyamine (LEVSIN SL) SL tablet 0.125 mg    CMA served as scribe during this visit. History, Physical and Plan performed by medical provider. Documentation and orders reviewed and attested to.  Ann Held, DO   Patient ID: KAWTHAR ENNEN, female   DOB: March 11, 1971, 46 y.o.   MRN: [id number]

## 2017-01-28 NOTE — Patient Instructions (Signed)
Irritable Bowel Syndrome, Adult Irritable bowel syndrome (IBS) is not one specific disease. It is a group of symptoms that affects the organs responsible for digestion (gastrointestinal or GI tract). To regulate how your GI tract works, your body sends signals back and forth between your intestines and your brain. If you have IBS, there may be a problem with these signals. As a result, your GI tract does not function normally. Your intestines may become more sensitive and overreact to certain things. This is especially true when you eat certain foods or when you are under stress. There are four types of IBS. These may be determined based on the consistency of your stool:  IBS with diarrhea.  IBS with constipation.  Mixed IBS.  Unsubtyped IBS. It is important to know which type of IBS you have. Some treatments are more likely to be helpful for certain types of IBS. What are the causes? The exact cause of IBS is not known. What increases the risk? You may have a higher risk of IBS if:  You are a woman.  You are younger than 45 years old.  You have a family history of IBS.  You have mental health problems.  You have had bacterial infection of your GI tract. What are the signs or symptoms? Symptoms of IBS vary from person to person. The main symptom is abdominal pain or discomfort. Additional symptoms usually include one or more of the following:  Diarrhea, constipation, or both.  Abdominal swelling or bloating.  Feeling full or sick after eating a small or regular-size meal.  Frequent gas.  Mucus in the stool.  A feeling of having more stool left after a bowel movement. Symptoms tend to come and go. They may be associated with stress, psychiatric conditions, or nothing at all. How is this diagnosed? There is no specific test to diagnose IBS. Your health care provider will make a diagnosis based on a physical exam, medical history, and your symptoms. You may have other tests to  rule out other conditions that may be causing your symptoms. These may include:  Blood tests.  X-rays.  CT scan.  Endoscopy and colonoscopy. This is a test in which your GI tract is viewed with a long, thin, flexible tube. How is this treated? There is no cure for IBS, but treatment can help relieve symptoms. IBS treatment often includes:  Changes to your diet, such as:  Eating more fiber.  Avoiding foods that cause symptoms.  Drinking more water.  Eating regular, medium-sized portioned meals.  Medicines. These may include:  Fiber supplements if you have constipation.  Medicine to control diarrhea (antidiarrheal medicines).  Medicine to help control muscle spasms in your GI tract (antispasmodic medicines).  Medicines to help with any mental health issues, such as antidepressants or tranquilizers.  Therapy.  Talk therapy may help with anxiety, depression, or other mental health issues that can make IBS symptoms worse.  Stress reduction.  Managing your stress can help keep symptoms under control. Follow these instructions at home:  Take medicines only as directed by your health care provider.  Eat a healthy diet.  Avoid foods and drinks with added sugar.  Include more whole grains, fruits, and vegetables gradually into your diet. This may be especially helpful if you have IBS with constipation.  Avoid any foods and drinks that make your symptoms worse. These may include dairy products and caffeinated or carbonated drinks.  Do not eat large meals.  Drink enough fluid to keep your urine   clear or pale yellow.  Exercise regularly. Ask your health care provider for recommendations of good activities for you.  Keep all follow-up visits as directed by your health care provider. This is important. Contact a health care provider if:  You have constant pain.  You have trouble or pain with swallowing.  You have worsening diarrhea. Get help right away if:  You  have severe and worsening abdominal pain.  You have diarrhea and:  You have a rash, stiff neck, or severe headache.  You are irritable, sleepy, or difficult to awaken.  You are weak, dizzy, or extremely thirsty.  You have bright red blood in your stool or you have black tarry stools.  You have unusual abdominal swelling that is painful.  You vomit continuously.  You vomit blood (hematemesis).  You have both abdominal pain and a fever. This information is not intended to replace advice given to you by your health care provider. Make sure you discuss any questions you have with your health care provider. Document Released: 11/02/2005 Document Revised: 04/03/2016 Document Reviewed: 07/20/2014 Elsevier Interactive Patient Education  2017 [employer]  

## 2017-01-29 LAB — TSH+T4F+T3FREE
Free T4: 1.87 ng/dL — ABNORMAL HIGH (ref 0.82–1.77)
T3 FREE: 3.1 pg/mL (ref 2.0–4.4)
TSH: 1.35 u[IU]/mL (ref 0.450–4.500)

## 2017-01-31 ENCOUNTER — Encounter: Payer: Self-pay | Admitting: Family Medicine

## 2017-02-01 ENCOUNTER — Other Ambulatory Visit: Payer: Self-pay | Admitting: Family Medicine

## 2017-02-01 DIAGNOSIS — E039 Hypothyroidism, unspecified: Secondary | ICD-10-CM

## 2017-02-01 MED ORDER — LEVOTHYROXINE SODIUM 88 MCG PO TABS: 88.0000 ug | ORAL_TABLET | Freq: Every day | ORAL | 3 refills | Status: DC

## 2017-02-15 ENCOUNTER — Other Ambulatory Visit: Payer: Self-pay | Admitting: Family Medicine

## 2017-02-15 DIAGNOSIS — I10 Essential (primary) hypertension: Secondary | ICD-10-CM

## 2017-02-16 ENCOUNTER — Telehealth: Payer: BLUE CROSS/BLUE SHIELD | Admitting: Nurse Practitioner

## 2017-02-16 DIAGNOSIS — K122 Cellulitis and abscess of mouth: Secondary | ICD-10-CM

## 2017-02-16 NOTE — Progress Notes (Signed)
Based on what you shared with me it looks like you have a serious condition that should be evaluated in a face to face office visit.  NOTE: Even if you have entered your credit card information for this eVisit, you will not be charged.   If you are having a true medical emergency please call 911.  If you need an urgent face to face visit, [place] has four urgent care centers for your convenience.  If you need care fast and have a high deductible or no insurance consider:   [email or web address]  [phone]  [address], [address] [place], [place] [address] 8 am to 8 pm Monday-Friday 10 am to 4 pm Saturday-Sunday   The following sites will take your  insurance:    . [facility]  [phone] Get Driving Directions Find a Provider at this Location  [address] [place], [place] [address] . 10 am to 8 pm Monday-Friday . 12 pm to 8 pm Saturday-Sunday   . [facility]  [phone] Get Driving Directions Find a Provider at this Location  [address], [address] [place], [place] [address] . 8 am to 8 pm Monday-Friday . 9 am to 6 pm Saturday . 11 am to 6 pm Sunday   . [facility] [place]  [phone] Get Driving Directions  [address] [place],  [address] . 8 am to 8 pm Monday-Friday . 8 am to 4 pm Saturday-Sunday   Your e-visit answers were reviewed by a board certified advanced clinical practitioner to complete your personal care plan.  Thank you for using e-Visits.  

## 2017-02-17 ENCOUNTER — Encounter: Payer: Self-pay | Admitting: *Deleted

## 2017-02-17 ENCOUNTER — Emergency Department
Admission: EM | Admit: 2017-02-17 | Discharge: 2017-02-17 | Disposition: A | Discharge status: Home / Self Care | Payer: BLUE CROSS/BLUE SHIELD | Source: Home / Self Care | Attending: Family Medicine | Admitting: Family Medicine

## 2017-02-17 DIAGNOSIS — K0500 Acute gingivitis, plaque induced: Secondary | ICD-10-CM

## 2017-02-17 MED ORDER — AMOXICILLIN 875 MG PO TABS: 875.0000 mg | ORAL_TABLET | Freq: Two times a day (BID) | ORAL | 0 refills | Status: DC

## 2017-02-17 NOTE — ED Provider Notes (Signed)
[facility]    CSN: [id number] Arrival date & time: 02/17/17  1247     History   Chief Complaint Chief Complaint  Patient presents with  . Mouth Lesions    HPI Megan Escobar is a 46 y.o. female.   Patient complains of onset of a sore spot on her right upper hard palate five days ago.  The area is painful when she eats.     The history is provided by the patient.  Mouth Lesions  Location:  Upper gingiva Upper gingiva location:  R buccal and R lingual Quality:  Red and painful Pain details:    Quality:  Aching   Severity:  Mild   Duration:  5 days   Timing:  Constant   Progression:  Worsening Onset quality:  Gradual Severity:  Mild Duration:  5 days Progression:  Worsening Chronicity:  New Relieved by:  Nothing Worsened by:  Eating Ineffective treatments:  None tried Associated symptoms: dental pain   Associated symptoms: no congestion, no ear pain, no fever, no neck pain, no rash, no rhinorrhea, no sore throat and no swollen glands     Past Medical History:  Diagnosis Date  . HTN (hypertension)   . Hyperlipidemia   . Thyroid disease    Hypothyroid    Patient Active Problem List   Diagnosis Date Noted  . Loose stools 01/28/2017  . Raynaud's disease 02/19/2015  . Cardiac murmur 11/24/2013  . BP (high blood pressure) 11/24/2013  . Beat, premature ventricular 11/24/2013  . EYE FLOATERS 07/10/2010  . Essential hypertension 07/10/2010  . Hypothyroidism 08/09/2009  . GERD 07/17/2009  . GAS/BLOATING 07/17/2009  . ABDOMINAL PAIN 07/17/2009    Past Surgical History:  Procedure Laterality Date  . CESAREAN SECTION     x's 2  . MYOMECTOMY      OB History    No data available       Home Medications    Prior to Admission medications   Medication Sig Start Date End Date Taking? Authorizing Provider  amLODipine (NORVASC) 2.5 MG tablet Take 1 tablet (2.5 mg total) by mouth daily. 09/15/16   Alferd Apa Lowne Chase, DO  amoxicillin  (AMOXIL) 875 MG tablet Take 1 tablet (875 mg total) by mouth 2 (two) times daily. 02/17/17   Kandra [facility], MD  CAMILA 0.35 MG tablet Take 1 tablet by mouth daily.  11/05/11   Historical Provider, MD  Hyoscyamine Sulfate SL (LEVSIN/SL) 0.125 MG SUBL Place 1 tablet under the tongue every 4 (four) hours as needed. 01/28/17   Rosalita Chessman Chase, DO  levothyroxine (SYNTHROID, LEVOTHROID) 88 MCG tablet Take 1 tablet (88 mcg total) by mouth daily before breakfast. 02/01/17   Rosalita Chessman Chase, DO  lisinopril (PRINIVIL,ZESTRIL) 10 MG tablet TAKE ONE TABLET BY MOUTH ONCE DAILY 02/15/17   Ann Held, DO    Family History Family History  Problem Relation Age of Onset  . Irritable bowel syndrome Mother   . Heart disease Mother 78    cabg  . Hypertension Mother   . Hyperlipidemia Mother   . Coronary artery disease Mother   . Heart disease Father     stent  . Hypertension Father   . Hyperlipidemia Father   . Coronary artery disease Father     Social History Social History  Substance Use Topics  . Smoking status: Never Smoker  . Smokeless tobacco: Never Used  . Alcohol use No     Allergies  Patient has no known allergies.   Review of Systems Review of Systems  Constitutional: Negative for fever.  HENT: Positive for mouth sores. Negative for congestion, ear pain, rhinorrhea and sore throat.   Musculoskeletal: Negative for neck pain.  Skin: Negative for rash.  All other systems reviewed and are negative.    Physical Exam Triage Vital Signs ED Triage Vitals  Enc Vitals Group     BP 02/17/17 1313 (!) 142/76     Pulse Rate 02/17/17 1313 (!) 53     Resp 02/17/17 1313 16     Temp 02/17/17 1313 97.6 F (36.4 C)     Temp Source 02/17/17 1313 Oral     SpO2 02/17/17 1313 100 %     Weight 02/17/17 1313 136 lb (61.7 kg)     Height 02/17/17 1313 5\' 2"  (1.575 m)     Head Circumference --      Peak Flow --      Pain Score 02/17/17 1314 0     Pain Loc --      Pain Edu? --       Excl. in [place]? --    No data found.   Updated Vital Signs BP (!) 142/76 (BP Location: Left Arm)   Pulse (!) 53   Temp 97.6 F (36.4 C) (Oral)   Resp 16   Ht 5\' 2"  (1.575 m)   Wt 136 lb (61.7 kg)   LMP 01/12/2017   SpO2 100%   BMI 24.87 kg/m   Visual Acuity Right Eye Distance:   Left Eye Distance:   Bilateral Distance:    Right Eye Near:   Left Eye Near:    Bilateral Near:     Physical Exam  Constitutional: She appears well-developed and well-nourished. No distress.  HENT:  Head: Normocephalic.  Right Ear: External ear normal.  Left Ear: External ear normal.  Nose: Nose normal.  Mouth/Throat:    Tender, swollen, erythematous gingiva at tooth #6.  No flucturance.  No tooth tenderness to tap.  Eyes: Pupils are equal, round, and reactive to light.  Neck: Neck supple.  Cardiovascular: Normal rate.   Pulmonary/Chest: Effort normal.  Lymphadenopathy:    She has no cervical adenopathy.  Neurological: She is alert.  Skin: Skin is warm and dry.  Nursing note and vitals reviewed.    UC Treatments / Results  Labs (all labs ordered are listed, but only abnormal results are displayed) Labs Reviewed - No data to display  EKG  EKG Interpretation None       Radiology No results found.  Procedures Procedures (including critical care time)  Medications Ordered in UC Medications - No data to display   Initial Impression / Assessment and Plan / UC Course  I have reviewed the triage vital signs and the nursing notes.  Pertinent labs & imaging results that were available during my care of the patient were reviewed by me and considered in my medical decision making (see chart for details).    Suspect early peri-apical abscess. Begin amoxicillin 875mg  BID Try warm salt water gargles.  May take Ibuprofen 200mg , 4 tabs every 8 hours with food.  Followup with dentist within one week.   Final Clinical Impressions(s) / UC Diagnoses   Final diagnoses:    Gingivitis, acute    New Prescriptions New Prescriptions   AMOXICILLIN (AMOXIL) 875 MG TABLET    Take 1 tablet (875 mg total) by mouth 2 (two) times daily.     Kandra Nicolas,  MD 02/22/17 1717

## 2017-02-17 NOTE — Discharge Instructions (Signed)
Try warm salt water gargles. °May take Ibuprofen 200mg, 4 tabs every 8 hours with food.  °

## 2017-02-17 NOTE — ED Triage Notes (Signed)
Pt c/o sore in her mouth x 5 days; painful with eating.

## 2017-03-15 ENCOUNTER — Ambulatory Visit: Payer: BLUE CROSS/BLUE SHIELD | Admitting: Family Medicine

## 2017-04-05 ENCOUNTER — Other Ambulatory Visit (INDEPENDENT_AMBULATORY_CARE_PROVIDER_SITE_OTHER): Payer: 59

## 2017-04-05 DIAGNOSIS — E039 Hypothyroidism, unspecified: Secondary | ICD-10-CM

## 2017-04-05 LAB — T4, FREE: FREE T4: 1.38 ng/dL (ref 0.60–1.60)

## 2017-04-05 LAB — TSH: TSH: 5.03 u[IU]/mL — AB (ref 0.35–4.50)

## 2017-04-05 LAB — T3, FREE: T3 FREE: 3 pg/mL (ref 2.3–4.2)

## 2017-04-06 ENCOUNTER — Other Ambulatory Visit: Payer: Self-pay | Admitting: Family Medicine

## 2017-04-06 DIAGNOSIS — E039 Hypothyroidism, unspecified: Secondary | ICD-10-CM

## 2017-04-06 MED ORDER — LEVOTHYROXINE SODIUM 100 MCG PO TABS: 100.0000 ug | ORAL_TABLET | Freq: Every day | ORAL | 1 refills | Status: DC

## 2017-04-14 ENCOUNTER — Telehealth: Payer: Self-pay | Admitting: Family Medicine

## 2017-04-14 NOTE — Telephone Encounter (Signed)
°  [facility] - [place], [place] - [address] [phone] (Phone) [phone] (Fax)   Pharmacy would like to know if manufacturer for levothyroxine (SYNTHROID, LEVOTHROID) 100 MCG tablet can be changed to "sandoz" manufacturer, please advise

## 2017-04-16 NOTE — Telephone Encounter (Signed)
Called left message to call back 

## 2017-04-16 NOTE — Telephone Encounter (Signed)
As long as ptis ok with it-- recheck tsh in 2 months

## 2017-04-16 NOTE — Telephone Encounter (Signed)
Patient informed and is ok with change Has appt already  Called pharmacy to inform ok to change.

## 2017-04-16 NOTE — Telephone Encounter (Signed)
Patient returning call.

## 2017-06-15 ENCOUNTER — Other Ambulatory Visit (INDEPENDENT_AMBULATORY_CARE_PROVIDER_SITE_OTHER): Payer: 59

## 2017-06-15 DIAGNOSIS — E039 Hypothyroidism, unspecified: Secondary | ICD-10-CM | POA: Diagnosis not present

## 2017-06-15 LAB — TSH: TSH: 0.69 u[IU]/mL (ref 0.35–4.50)

## 2017-06-16 ENCOUNTER — Encounter: Payer: Self-pay | Admitting: *Deleted

## 2017-10-04 ENCOUNTER — Encounter: Payer: Self-pay | Admitting: Family Medicine

## 2017-10-04 NOTE — Telephone Encounter (Signed)
Align 1 po qd #30  11 refills

## 2017-10-06 MED ORDER — ALIGN PO CAPS: 1.0000 | ORAL_CAPSULE | Freq: Every day | ORAL | 11 refills | Status: DC

## 2017-10-11 ENCOUNTER — Other Ambulatory Visit: Payer: Self-pay | Admitting: Family Medicine

## 2017-10-13 MED ORDER — ALIGN PO CAPS: 1.0000 | ORAL_CAPSULE | Freq: Every day | ORAL | 3 refills | Status: DC

## 2017-10-13 MED ORDER — LEVOTHYROXINE SODIUM 100 MCG PO TABS: 100.0000 ug | ORAL_TABLET | Freq: Every day | ORAL | 1 refills | Status: DC

## 2017-10-13 NOTE — Telephone Encounter (Signed)
Align resent and levothyroxine refilled to [facility].

## 2017-10-13 NOTE — Addendum Note (Signed)
Addended byDamita Dunnings D on: 10/13/2017 04:54 PM   Modules accepted: Orders

## 2017-10-13 NOTE — Telephone Encounter (Signed)
Pt is also wanting an update on her prescription  For align, so she can use FSA card

## 2017-10-13 NOTE — Telephone Encounter (Signed)
Pt called to check on status of refill for levothyroxine Pt has one more pill left [facility]

## 2017-10-13 NOTE — Telephone Encounter (Deleted)
Pt is wanting an update on this for her prescription  For align, so she can use FSA card

## 2018-01-12 ENCOUNTER — Telehealth: Payer: Self-pay | Admitting: *Deleted

## 2018-01-12 DIAGNOSIS — E039 Hypothyroidism, unspecified: Secondary | ICD-10-CM

## 2018-01-12 NOTE — Telephone Encounter (Signed)
[facility] faxed over that they changed brand of levothyroxine.  Left message on machine that we need to check tsh in 2 months.  Order placed.

## 2018-01-24 ENCOUNTER — Other Ambulatory Visit: Payer: Self-pay | Admitting: Family Medicine

## 2018-01-24 DIAGNOSIS — I10 Essential (primary) hypertension: Secondary | ICD-10-CM

## 2018-04-27 LAB — HM MAMMOGRAPHY

## 2018-04-27 LAB — HM PAP SMEAR: HM Pap smear: NEGATIVE

## 2018-05-23 ENCOUNTER — Other Ambulatory Visit: Payer: Self-pay | Admitting: Family Medicine

## 2018-05-23 DIAGNOSIS — I10 Essential (primary) hypertension: Secondary | ICD-10-CM

## 2018-08-17 ENCOUNTER — Other Ambulatory Visit: Payer: Self-pay | Admitting: Family Medicine

## 2018-08-17 DIAGNOSIS — I10 Essential (primary) hypertension: Secondary | ICD-10-CM

## 2018-08-30 ENCOUNTER — Ambulatory Visit: Payer: 59 | Admitting: Family Medicine

## 2018-08-30 ENCOUNTER — Encounter: Payer: Self-pay | Admitting: Family Medicine

## 2018-08-30 VITALS — BP 121/60 | HR 97 | Temp 98.4°F | Resp 16 | Ht 62.0 in | Wt 129.2 lb

## 2018-08-30 DIAGNOSIS — I7300 Raynaud's syndrome without gangrene: Secondary | ICD-10-CM | POA: Diagnosis not present

## 2018-08-30 DIAGNOSIS — I10 Essential (primary) hypertension: Secondary | ICD-10-CM

## 2018-08-30 DIAGNOSIS — E039 Hypothyroidism, unspecified: Secondary | ICD-10-CM

## 2018-08-30 MED ORDER — AMLODIPINE BESYLATE 2.5 MG PO TABS: 2.5000 mg | ORAL_TABLET | Freq: Every day | ORAL | 3 refills | Status: DC

## 2018-08-30 MED ORDER — LISINOPRIL 10 MG PO TABS: 10.0000 mg | ORAL_TABLET | Freq: Every day | ORAL | 1 refills | Status: DC

## 2018-08-30 NOTE — Progress Notes (Signed)
Patient ID: Megan Escobar, female    DOB: [birth year]  Age: 47 y.o. MRN: [id number]    Subjective:  Subjective  HPI AIXA CORSELLO presents for f/u bp , chol and thyroid.  No complaints     Review of Systems  Constitutional: Negative for chills and fever.  HENT: Negative for congestion and hearing loss.   Eyes: Negative for discharge.  Respiratory: Negative for cough and shortness of breath.   Cardiovascular: Negative for chest pain, palpitations and leg swelling.  Gastrointestinal: Negative for abdominal pain, blood in stool, constipation, diarrhea, nausea and vomiting.  Genitourinary: Negative for dysuria, frequency, hematuria and urgency.  Musculoskeletal: Negative for back pain and myalgias.  Skin: Negative for rash.  Allergic/Immunologic: Negative for environmental allergies.  Neurological: Negative for dizziness, weakness and headaches.  Hematological: Does not bruise/bleed easily.  Psychiatric/Behavioral: Negative for suicidal ideas. The patient is not nervous/anxious.     History Past Medical History:  Diagnosis Date  . HTN (hypertension)   . Hyperlipidemia   . Thyroid disease    Hypothyroid    She has a past surgical history that includes Cesarean section and Myomectomy.   Her family history includes Coronary artery disease in her father and mother; Heart disease in her father; Heart disease (age of onset: 62) in her mother; Hyperlipidemia in her father and mother; Hypertension in her father and mother; Irritable bowel syndrome in her mother.She reports that she has never smoked. She has never used smokeless tobacco. She reports that she does not drink alcohol or use drugs.  Current Outpatient Medications on File Prior to Visit  Medication Sig Dispense Refill  . bifidobacterium infantis (ALIGN) capsule Take 1 capsule by mouth daily. 90 capsule 3  . CAMILA 0.35 MG tablet Take 1 tablet by mouth daily.     Marland Kitchen levothyroxine (SYNTHROID, LEVOTHROID) 100 MCG tablet  Take 1 tablet (100 mcg total) by mouth daily before breakfast. 90 tablet 1   Current Facility-Administered Medications on File Prior to Visit  Medication Dose Route Frequency Provider Last Rate Last Dose  . hyoscyamine (LEVSIN SL) SL tablet 0.125 mg  0.125 mg Oral Q4H PRN Roma Schanz R, DO         Objective:  Objective  Physical Exam  Constitutional: She is oriented to person, place, and time. She appears well-developed and well-nourished.  HENT:  Head: Normocephalic and atraumatic.  Eyes: Conjunctivae and EOM are normal.  Neck: Normal range of motion. Neck supple. No JVD present. Carotid bruit is not present. No thyromegaly present.  Cardiovascular: Normal rate, regular rhythm and normal heart sounds.  No murmur heard. Pulmonary/Chest: Effort normal and breath sounds normal. No respiratory distress. She has no wheezes. She has no rales. She exhibits no tenderness.  Musculoskeletal: She exhibits no edema.  Neurological: She is alert and oriented to person, place, and time.  Psychiatric: She has a normal mood and affect.  Nursing note and vitals reviewed.  BP 121/60 (BP Location: Right Arm, Cuff Size: Normal)   Pulse 97   Temp 98.4 F (36.9 C) (Oral)   Resp 16   Ht 5\' 2"  (1.575 m)   Wt 129 lb 3.2 oz (58.6 kg)   SpO2 100%   BMI 23.63 kg/m  Wt Readings from Last 3 Encounters:  08/30/18 129 lb 3.2 oz (58.6 kg)  02/17/17 136 lb (61.7 kg)  01/28/17 137 lb 12.8 oz (62.5 kg)     Lab Results  Component Value Date   WBC 6.5 01/28/2017  HGB 14.4 01/28/2017   HCT 42.9 01/28/2017   PLT 291.0 01/28/2017   GLUCOSE 114 (H) 01/28/2017   CHOL 151 09/15/2016   TRIG 38.0 09/15/2016   HDL 64.90 09/15/2016   LDLCALC 79 09/15/2016   ALT 25 01/28/2017   AST 21 01/28/2017   NA 135 01/28/2017   K 3.8 01/28/2017   CL 101 01/28/2017   CREATININE 0.77 01/28/2017   BUN 8 01/28/2017   CO2 27 01/28/2017   TSH 0.69 06/15/2017   HGBA1C 5.5 09/15/2016    No results found.     Assessment & Plan:  Plan  I have discontinued Joelene Millin B. Digiulio's Hyoscyamine Sulfate SL and amoxicillin. I am also having her maintain her CAMILA, levothyroxine, bifidobacterium infantis, lisinopril, and amLODipine. We will continue to administer hyoscyamine.  Meds ordered this encounter  Medications  . DISCONTD: lisinopril (PRINIVIL,ZESTRIL) 10 MG tablet    Sig: Take 1 tablet (10 mg total) by mouth daily.    Dispense:  90 tablet    Refill:  1  . lisinopril (PRINIVIL,ZESTRIL) 10 MG tablet    Sig: Take 1 tablet (10 mg total) by mouth daily.    Dispense:  90 tablet    Refill:  1  . amLODipine (NORVASC) 2.5 MG tablet    Sig: Take 1 tablet (2.5 mg total) by mouth daily.    Dispense:  90 tablet    Refill:  3    Problem List Items Addressed This Visit      Unprioritized   Essential hypertension    Well controlled, no changes to meds. Encouraged heart healthy diet such as the DASH diet and exercise as tolerated.        Relevant Medications   lisinopril (PRINIVIL,ZESTRIL) 10 MG tablet   amLODipine (NORVASC) 2.5 MG tablet   Other Relevant Orders   Comprehensive metabolic panel   Lipid panel   Hypothyroidism - Primary    Stable Check labs Con't meds      Relevant Orders   TSH   Raynaud's disease    Stable Uses norvasc in winter       Relevant Medications   lisinopril (PRINIVIL,ZESTRIL) 10 MG tablet   amLODipine (NORVASC) 2.5 MG tablet      Follow-up: Return in about 1 year (around 08/31/2019), or if symptoms worsen or fail to improve, for annual exam, fasting.  Ann Held, DO

## 2018-08-30 NOTE — Assessment & Plan Note (Signed)
Well controlled, no changes to meds. Encouraged heart healthy diet such as the DASH diet and exercise as tolerated.  °

## 2018-08-30 NOTE — Assessment & Plan Note (Signed)
Stable Uses norvasc in winter

## 2018-08-30 NOTE — Patient Instructions (Signed)

## 2018-08-30 NOTE — Assessment & Plan Note (Signed)
Stable Check labs Con't meds

## 2018-08-31 LAB — LIPID PANEL
CHOL/HDL RATIO: 2
CHOLESTEROL: 157 mg/dL (ref 0–200)
HDL: 62.9 mg/dL (ref >39.00)
LDL CALC: 85 mg/dL (ref 0–99)
NonHDL: 93.72
TRIGLYCERIDES: 44 mg/dL (ref 0.0–149.0)
VLDL: 8.8 mg/dL (ref 0.0–40.0)

## 2018-08-31 LAB — COMPREHENSIVE METABOLIC PANEL
ALT: 18 U/L (ref 0–35)
AST: 16 U/L (ref 0–37)
Albumin: 4.4 g/dL (ref 3.5–5.2)
Alkaline Phosphatase: 57 U/L (ref 39–117)
BUN: 13 mg/dL (ref 6–23)
CALCIUM: 9.6 mg/dL (ref 8.4–10.5)
CHLORIDE: 101 meq/L (ref 96–112)
CO2: 28 meq/L (ref 19–32)
Creatinine, Ser: 0.89 mg/dL (ref 0.40–1.20)
GFR: 72.29 mL/min (ref >60.00)
Glucose, Bld: 84 mg/dL (ref 70–99)
POTASSIUM: 4.3 meq/L (ref 3.5–5.1)
SODIUM: 137 meq/L (ref 135–145)
Total Bilirubin: 0.5 mg/dL (ref 0.2–1.2)
Total Protein: 7.6 g/dL (ref 6.0–8.3)

## 2018-08-31 LAB — TSH: TSH: 0.64 u[IU]/mL (ref 0.35–4.50)

## 2018-09-28 ENCOUNTER — Encounter: Payer: Self-pay | Admitting: Family Medicine

## 2018-09-28 DIAGNOSIS — I10 Essential (primary) hypertension: Secondary | ICD-10-CM

## 2018-09-28 MED ORDER — LEVOTHYROXINE SODIUM 100 MCG PO TABS: 100.0000 ug | ORAL_TABLET | Freq: Every day | ORAL | 3 refills | Status: DC

## 2018-09-28 MED ORDER — LISINOPRIL 10 MG PO TABS: 10.0000 mg | ORAL_TABLET | Freq: Every day | ORAL | 3 refills | Status: DC

## 2018-10-27 ENCOUNTER — Encounter: Payer: Self-pay | Admitting: Family Medicine

## 2019-01-09 ENCOUNTER — Other Ambulatory Visit: Payer: Self-pay | Admitting: Family Medicine

## 2019-03-07 ENCOUNTER — Emergency Department: Admission: EM | Admit: 2019-03-07 | Discharge: 2019-03-07 | Payer: Self-pay | Source: Home / Self Care

## 2019-03-07 ENCOUNTER — Telehealth: Payer: 59 | Admitting: Physician Assistant

## 2019-03-07 ENCOUNTER — Other Ambulatory Visit: Payer: Self-pay

## 2019-03-07 DIAGNOSIS — T148XXA Other injury of unspecified body region, initial encounter: Secondary | ICD-10-CM

## 2019-03-07 DIAGNOSIS — Z23 Encounter for immunization: Secondary | ICD-10-CM

## 2019-03-07 NOTE — Progress Notes (Signed)
Based on what you shared with me, I feel your condition warrants further evaluation and I recommend that you be seen for a face to face office visit. You need a tetanus shot and this wound should be formally evaluated to ensure that you receive the best possible outcome. Please proceed to one of the locations below. You will not be charged for this visit.      NOTE: If you entered your credit card information for this eVisit, you will not be charged. You may see a "hold" on your card for the $35 but that hold will drop off and you will not have a charge processed.  If you are having a true medical emergency please call 911.  If you need an urgent face to face visit, [facility] has four urgent care centers for your convenience.    PLEASE NOTE: THE [facility] LOCATIONS AND URGENT CARE CLINICS DO NOT HAVE THE TESTING FOR CORONAVIRUS COVID19 AVAILABLE.  IF YOU FEEL YOU NEED THIS TEST YOU MUST GO TO A TRIAGE LOCATION AT [facility] ?  [email or web address] to reserve your spot online an avoid wait times  [facility] [address] [place], [place] [address] Modified hours of operation: Monday-Friday, 10 AM to 6 PM  Saturday & Sunday 10 AM to 4 PM *Across the street from [place] (New Address!) [address] [place], [place] [address] *Just off [address], across the road from [place] hours of operation: Monday-Friday, 10 AM to 5 PM  Closed Saturday & Sunday   The following sites will take your insurance:  [facility] a Provider at this Location  [place], [place] [address] 10 am to 8 pm Monday-Friday 12 pm to 8 pm [facility] a Provider at this Location  [place], [place],  [address] 8 am to 8 pm Monday-Friday 9 am to 6 pm Saturday 11 am to 6 pm Sunday   [facility]  [phone] Get Driving Directions  [address] [place], [place] [address] 8 am to 8 pm Monday-Friday 8 am to 4 pm Saturday-Sunday   Your e-visit answers were reviewed by a board certified advanced clinical practitioner to complete your personal care plan.  Thank you for using e-Visits.  ===View-only below this line===   ----- Message -----    From: Billey Co    Sent: 03/07/2019  9:11 AM EDT      To: E-Visit Mailing List Subject: E-Visit Submission: Cellulitis  E-Visit Submission: Cellulitis --------------------------------  Question: Please describe how your skin condition appears.  You may choose more than one. Answer:   Redness  Question: What areas are affected? Answer:   Other  Question: Location of Cellulitus comments Answer:   Right  buttock  Question: Did you previously have; Answer:   Wound  Question: Do you have a history of; (choose all that apply) Answer:   None of the above  Question: Do you have a fever? Answer:   No, I do not have a fever  Question: Do you have chills? Answer:   No  Question: Do you have swollen glands? Answer:   No  Question: Does the affected area itch? Answer:   No  Question: Is the area painful? Answer:  Yes  Question: Please rate your pain on a scale of 1-10.  1 being the least and 10 being the worst. Answer:   3  Question: How long have you been having these symptoms? Answer:   3 days  Question: Have you had a tick bite in the last 2 weeks? Answer:   No  Question: Have you tried any over-the-counter medications? Answer:   Triple antipiotic ointment/Neosporin  Question: Did any of the medications help? Answer:   No  Question: Please list your medication allergies that you may have ? (If 'none' , please list as 'none') Answer:    None  Question: Please list any additional comments  Answer:   I was bit by a dog on Saturday 4/18. I have superficial skin punctures and bruising.  I have been treating with triple antibiotic ointment but small area has become more red and tender.  It is scabbed but not draining.  Question: Are you pregnant? Answer:   I am confident that I am not pregnant  Question: Are you breastfeeding? Answer:   No  A total of 5-10 minutes was spent evaluating this patients questionnaire and formulating a plan of care.

## 2019-06-01 ENCOUNTER — Other Ambulatory Visit: Payer: Self-pay | Admitting: Obstetrics & Gynecology

## 2019-06-01 DIAGNOSIS — R928 Other abnormal and inconclusive findings on diagnostic imaging of breast: Secondary | ICD-10-CM

## 2019-06-06 ENCOUNTER — Ambulatory Visit: Payer: 59

## 2019-06-06 ENCOUNTER — Ambulatory Visit
Admission: RE | Admit: 2019-06-06 | Discharge: 2019-06-06 | Disposition: A | Discharge status: Home / Self Care | Payer: 59 | Source: Ambulatory Visit | Attending: Obstetrics & Gynecology | Admitting: Obstetrics & Gynecology

## 2019-06-06 ENCOUNTER — Other Ambulatory Visit: Payer: Self-pay

## 2019-06-06 DIAGNOSIS — R928 Other abnormal and inconclusive findings on diagnostic imaging of breast: Secondary | ICD-10-CM

## 2019-08-17 ENCOUNTER — Telehealth: Payer: Self-pay | Admitting: *Deleted

## 2019-08-17 MED ORDER — LEVOTHYROXINE SODIUM 100 MCG PO TABS: 100.0000 ug | ORAL_TABLET | Freq: Every day | ORAL | 1 refills | Status: DC

## 2019-08-17 NOTE — Telephone Encounter (Signed)
Received request from [facility] for levothyroxin 120mcg once daily. Refill sent.

## 2019-09-04 ENCOUNTER — Encounter: Payer: 59 | Admitting: Family Medicine

## 2019-10-24 ENCOUNTER — Telehealth: Payer: Self-pay | Admitting: Family Medicine

## 2019-10-24 NOTE — Telephone Encounter (Signed)
Pt called back regarding rescheduling CPE from 12/9 as provider working remotely. Pt said she doesn't see the point of a virtual CPE. Pt stated that appt was scheduled 2 months ago (10/13 by [facility] agent) and she doesn't understand why the office waited until the day before to contact her. Pt was rescheduled to 12/22/2019 first available and asking to have sooner appt or have lab work now and skip the appointment.  Can we work pt in sooner for CPE?

## 2019-10-25 ENCOUNTER — Encounter: Payer: 59 | Admitting: Family Medicine

## 2019-10-25 NOTE — Telephone Encounter (Signed)
Megan Escobar called patient earlier today and there was no answer.

## 2019-11-02 ENCOUNTER — Other Ambulatory Visit: Payer: Self-pay

## 2019-11-03 ENCOUNTER — Ambulatory Visit (INDEPENDENT_AMBULATORY_CARE_PROVIDER_SITE_OTHER): Payer: 59 | Admitting: Family Medicine

## 2019-11-03 ENCOUNTER — Encounter: Payer: Self-pay | Admitting: Family Medicine

## 2019-11-03 VITALS — BP 112/80 | HR 82 | Temp 97.9°F | Resp 18 | Ht 62.0 in | Wt 131.8 lb

## 2019-11-03 DIAGNOSIS — I7300 Raynaud's syndrome without gangrene: Secondary | ICD-10-CM

## 2019-11-03 DIAGNOSIS — Z0000 Encounter for general adult medical examination without abnormal findings: Secondary | ICD-10-CM

## 2019-11-03 DIAGNOSIS — E039 Hypothyroidism, unspecified: Secondary | ICD-10-CM | POA: Diagnosis not present

## 2019-11-03 DIAGNOSIS — I10 Essential (primary) hypertension: Secondary | ICD-10-CM

## 2019-11-03 LAB — CBC WITH DIFFERENTIAL/PLATELET
Basophils Absolute: 0 10*3/uL (ref 0.0–0.1)
Basophils Relative: 0.3 % (ref 0.0–3.0)
Eosinophils Absolute: 0 10*3/uL (ref 0.0–0.7)
Eosinophils Relative: 0.3 % (ref 0.0–5.0)
HCT: 43.1 % (ref 36.0–46.0)
Hemoglobin: 14.3 g/dL (ref 12.0–15.0)
Lymphocytes Relative: 16.7 % (ref 12.0–46.0)
Lymphs Abs: 1.3 10*3/uL (ref 0.7–4.0)
MCHC: 33.1 g/dL (ref 30.0–36.0)
MCV: 91.2 fl (ref 78.0–100.0)
Monocytes Absolute: 0.5 10*3/uL (ref 0.1–1.0)
Monocytes Relative: 6 % (ref 3.0–12.0)
Neutro Abs: 5.8 10*3/uL (ref 1.4–7.7)
Neutrophils Relative %: 76.7 % (ref 43.0–77.0)
Platelets: 275 10*3/uL (ref 150.0–400.0)
RBC: 4.72 Mil/uL (ref 3.87–5.11)
RDW: 13.2 % (ref 11.5–15.5)
WBC: 7.6 10*3/uL (ref 4.0–10.5)

## 2019-11-03 LAB — COMPREHENSIVE METABOLIC PANEL
ALT: 31 U/L (ref 0–35)
AST: 25 U/L (ref 0–37)
Albumin: 4.4 g/dL (ref 3.5–5.2)
Alkaline Phosphatase: 59 U/L (ref 39–117)
BUN: 12 mg/dL (ref 6–23)
CO2: 30 mEq/L (ref 19–32)
Calcium: 9.4 mg/dL (ref 8.4–10.5)
Chloride: 102 mEq/L (ref 96–112)
Creatinine, Ser: 0.89 mg/dL (ref 0.40–1.20)
GFR: 67.68 mL/min (ref >60.00)
Glucose, Bld: 114 mg/dL — ABNORMAL HIGH (ref 70–99)
Potassium: 3.9 mEq/L (ref 3.5–5.1)
Sodium: 137 mEq/L (ref 135–145)
Total Bilirubin: 0.5 mg/dL (ref 0.2–1.2)
Total Protein: 7.6 g/dL (ref 6.0–8.3)

## 2019-11-03 LAB — LIPID PANEL
Cholesterol: 150 mg/dL (ref 0–200)
HDL: 64 mg/dL (ref >39.00)
LDL Cholesterol: 78 mg/dL (ref 0–99)
NonHDL: 85.71
Total CHOL/HDL Ratio: 2
Triglycerides: 37 mg/dL (ref 0.0–149.0)
VLDL: 7.4 mg/dL (ref 0.0–40.0)

## 2019-11-03 LAB — TSH: TSH: 1.24 u[IU]/mL (ref 0.35–4.50)

## 2019-11-03 MED ORDER — ALIGN 4 MG PO CAPS: 1.0000 | ORAL_CAPSULE | Freq: Every day | ORAL | 11 refills | Status: DC

## 2019-11-03 MED ORDER — LISINOPRIL 10 MG PO TABS: 10.0000 mg | ORAL_TABLET | Freq: Every day | ORAL | 3 refills | Status: DC

## 2019-11-03 MED ORDER — AMLODIPINE BESYLATE 2.5 MG PO TABS: 2.5000 mg | ORAL_TABLET | Freq: Every day | ORAL | 3 refills | Status: DC

## 2019-11-03 MED ORDER — LEVOTHYROXINE SODIUM 100 MCG PO TABS: 100.0000 ug | ORAL_TABLET | Freq: Every day | ORAL | 3 refills | Status: DC

## 2019-11-03 NOTE — Progress Notes (Signed)
Subjective:     Megan Escobar is a 48 y.o. female and is here for a comprehensive physical exam. The patient reports no problems.  Social History   Socioeconomic History  . Marital status: Married    Spouse name: Not on file  . Number of children: Not on file  . Years of education: Not on file  . Highest education level: Not on file  Occupational History  . 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[employer]  Tobacco Use  . Smoking status: Never Smoker  . Smokeless tobacco: Never Used  Substance and Sexual Activity  . Alcohol use: No  . Drug use: No  . Sexual activity: Yes  Other Topics Concern  . Not on file  Social History Narrative  . Not on file   Social Determinants of Health   Financial Resource Strain:   . Difficulty of Paying Living Expenses: Not on file  Food Insecurity:   . Worried About Charity fundraiser in the Last Year: Not on file  . Ran Out of Food in the Last Year: Not on file  Transportation Needs:   . Lack of Transportation (Medical): Not on file  . Lack of Transportation (Non-Medical): Not on file  Physical Activity:   . Days of Exercise per Week: Not on file  . Minutes of Exercise per Session: Not on file  Stress:   . Feeling of Stress : Not on file  Social Connections:   . Frequency of Communication with Friends and Family: Not on file  . Frequency of Social Gatherings with Friends and Family: Not on file  . Attends Religious Services: Not on file  . Active Member of Clubs or Organizations: Not on file  . Attends Archivist Meetings: Not on file  . Marital Status: Not on file  Intimate Partner Violence:   . Fear of Current or Ex-Partner: Not on file  . Emotionally Abused: Not on file  . Physically Abused: Not on file  . Sexually Abused: Not on file   Health Maintenance  Topic Date Due  . TETANUS/TDAP  11/22/2016  . MAMMOGRAM  04/28/2019  . HIV Screening  08/30/2024 (Originally 10/[birth year]/1986)  . PAP SMEAR-Modifier   04/27/2021  . INFLUENZA VACCINE  Completed    The following portions of the patient's history were reviewed and updated as appropriate:  She  has a past medical history of HTN (hypertension), Hyperlipidemia, Raynaud's disease, and Thyroid disease. She does not have any pertinent problems on file. She  has a past surgical history that includes Cesarean section and Myomectomy. Her family history includes Breast cancer in her paternal aunt and paternal grandmother; Coronary artery disease in her father and mother; Heart disease in her father; Heart disease (age of onset: 29) in her mother; Hyperlipidemia in her father and mother; Hypertension in her father and mother; Irritable bowel syndrome in her mother. She  reports that she has never smoked. She has never used smokeless tobacco. She reports that she does not drink alcohol or use drugs. She has a current medication list which includes the following prescription(s): amlodipine, camila, levothyroxine, lisinopril, and align, and the following Facility-Administered Medications: hyoscyamine. Current Outpatient Medications on File Prior to Visit  Medication Sig Dispense Refill  . CAMILA 0.35 MG tablet Take 1 tablet by mouth daily.      Current Facility-Administered Medications on File Prior to Visit  Medication Dose Route Frequency Provider Last Rate Last Admin  . hyoscyamine (LEVSIN SL) SL  tablet 0.125 mg  0.125 mg Oral Q4H PRN Ann Held, DO       She has No Known Allergies..  Review of Systems Review of Systems  Constitutional: Negative for activity change, appetite change and fatigue.  HENT: Negative for hearing loss, congestion, tinnitus and ear discharge.  dentist q89m Eyes: Negative for visual disturbance (see optho q1y -- vision corrected to 20/20 with glasses).  Respiratory: Negative for cough, chest tightness and shortness of breath.   Cardiovascular: Negative for chest pain, palpitations and leg swelling.   Gastrointestinal: Negative for abdominal pain, diarrhea, constipation and abdominal distention.  Genitourinary: Negative for urgency, frequency, decreased urine volume and difficulty urinating.  Musculoskeletal: Negative for back pain, arthralgias and gait problem.  Skin: Negative for color change, pallor and rash.  Neurological: Negative for dizziness, light-headedness, numbness and headaches.  Hematological: Negative for adenopathy. Does not bruise/bleed easily.  Psychiatric/Behavioral: Negative for suicidal ideas, confusion, sleep disturbance, self-injury, dysphoric mood, decreased concentration and agitation.       Objective:    BP 112/80 (BP Location: Right Arm, Patient Position: Sitting, Cuff Size: Normal)   Pulse 82   Temp 97.9 F (36.6 C) (Temporal)   Resp 18   Ht 5\' 2"  (1.575 m)   Wt 131 lb 12.8 oz (59.8 kg)   SpO2 100%   BMI 24.11 kg/m  General appearance: alert, cooperative and no distress Head: Normocephalic, without obvious abnormality, atraumatic Eyes: negative findings: lids and lashes normal, conjunctivae and sclerae normal and pupils equal, round, reactive to light and accomodation Ears: normal TM's and external ear canals both ears Nose: Nares normal. Septum midline. Mucosa normal. No drainage or sinus tenderness. Throat: lips, mucosa, and tongue normal; teeth and gums normal Neck: no adenopathy, no carotid bruit, no JVD, supple, symmetrical, trachea midline and thyroid not enlarged, symmetric, no tenderness/mass/nodules Back: symmetric, no curvature. ROM normal. No CVA tenderness. Lungs: clear to auscultation bilaterally Breasts: gyn Heart: regular rate and rhythm, S1, S2 normal, no murmur, click, rub or gallop Abdomen: soft, non-tender; bowel sounds normal; no masses,  no organomegaly Pelvic: deferred Extremities: extremities normal, atraumatic, no cyanosis or edema Pulses: 2+ and symmetric Skin: Skin color, texture, turgor normal. No rashes or  lesions Lymph nodes: Cervical, supraclavicular, and axillary nodes normal. Neurologic: Alert and oriented X 3, normal strength and tone. Normal symmetric reflexes. Normal coordination and gait    Assessment:    Healthy female exam.      Plan:     ghm utd Check labs  See After Visit Summary for Counseling Recommendations    1. Essential hypertension Well controlled, no changes to meds. Encouraged heart healthy diet such as the DASH diet and exercise as tolerated.   - amLODipine (NORVASC) 2.5 MG tablet; Take 1 tablet (2.5 mg total) by mouth daily.  Dispense: 90 tablet; Refill: 3 - lisinopril (ZESTRIL) 10 MG tablet; Take 1 tablet (10 mg total) by mouth daily.  Dispense: 90 tablet; Refill: 3 - CBC with Differential - Lipid panel - TSH - Comprehensive metabolic panel  2. Raynaud's disease without gangrene stable - amLODipine (NORVASC) 2.5 MG tablet; Take 1 tablet (2.5 mg total) by mouth daily.  Dispense: 90 tablet; Refill: 3 - CBC with Differential - Lipid panel - TSH - Comprehensive metabolic panel  3. Hypothyroidism, unspecified type Check labs  con't meds  - levothyroxine (SYNTHROID) 100 MCG tablet; Take 1 tablet (100 mcg total) by mouth daily before breakfast.  Dispense: 90 tablet; Refill: 3 - TSH

## 2019-11-03 NOTE — Patient Instructions (Signed)

## 2019-11-06 ENCOUNTER — Other Ambulatory Visit: Payer: Self-pay | Admitting: Family Medicine

## 2019-11-06 DIAGNOSIS — R739 Hyperglycemia, unspecified: Secondary | ICD-10-CM

## 2019-12-22 ENCOUNTER — Encounter: Payer: 59 | Admitting: Family Medicine

## 2020-05-03 ENCOUNTER — Ambulatory Visit: Payer: 59 | Admitting: Family Medicine

## 2020-05-04 IMAGING — MG DIGITAL DIAGNOSTIC UNILATERAL RIGHT MAMMOGRAM WITH TOMO AND CAD
4 series · 4 of 12 positions shown · non-contrast
Comparison: Previous exam(s).

CLINICAL DATA: Screening recall for a possible right breast mass.

EXAM:
DIGITAL DIAGNOSTIC UNILATERAL RIGHT MAMMOGRAM WITH CAD AND TOMO

[R MLO synth-2D]
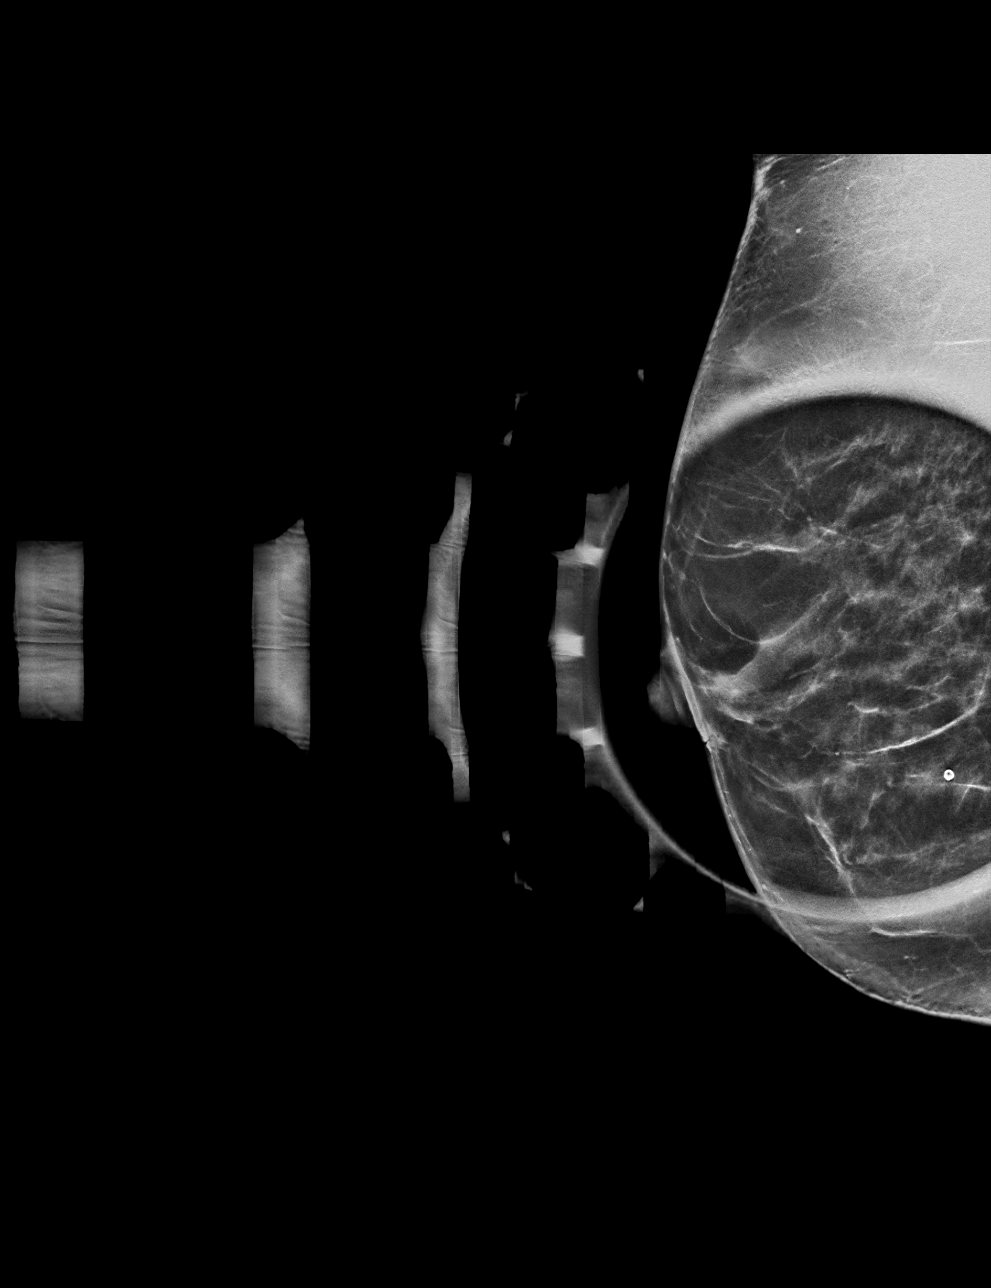

[R CC synth-2D]
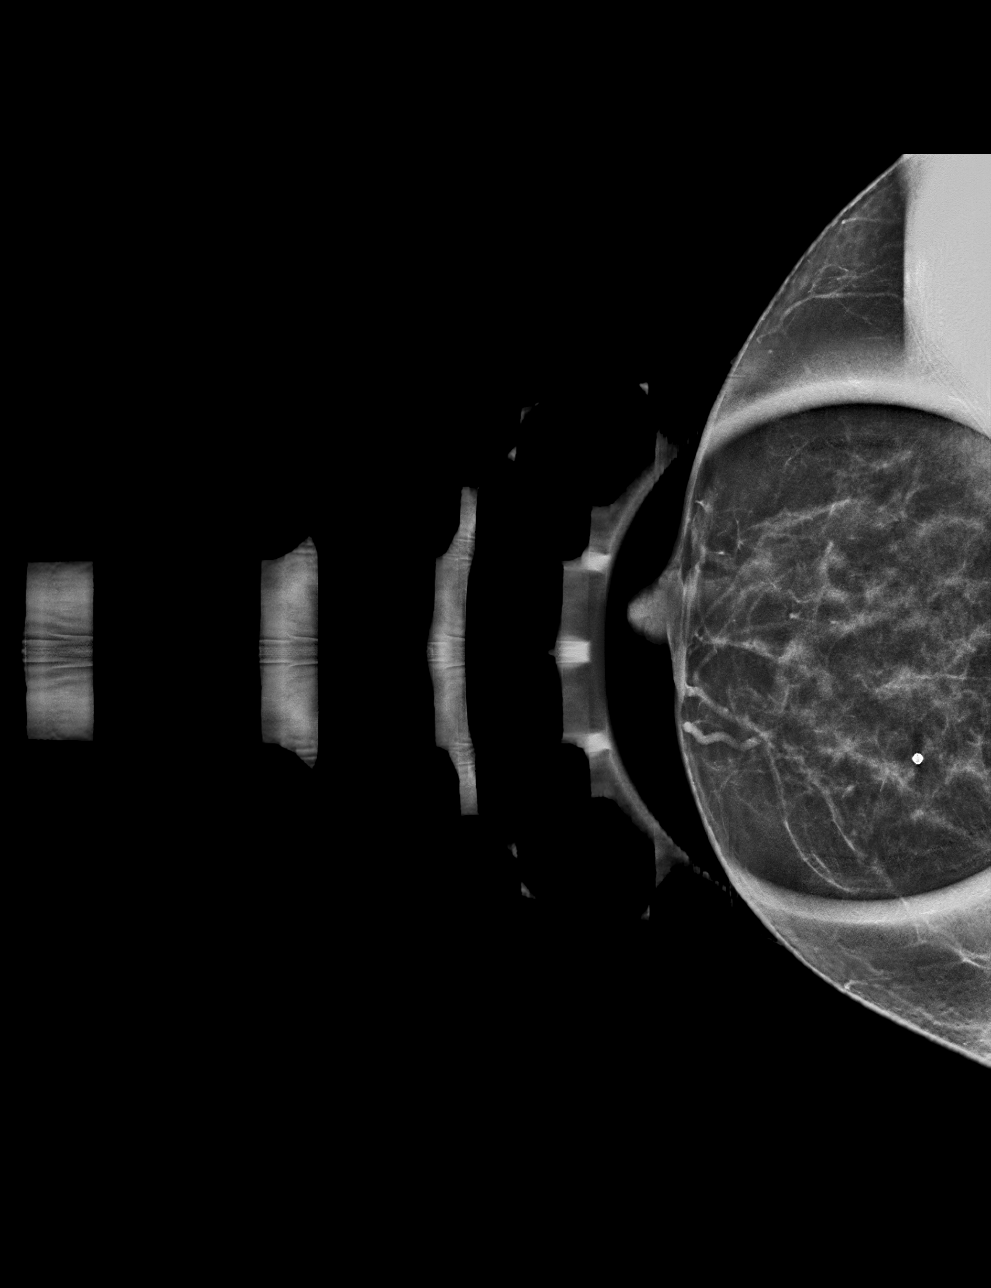

[R CC tomo · tomo slice 25/50.0]
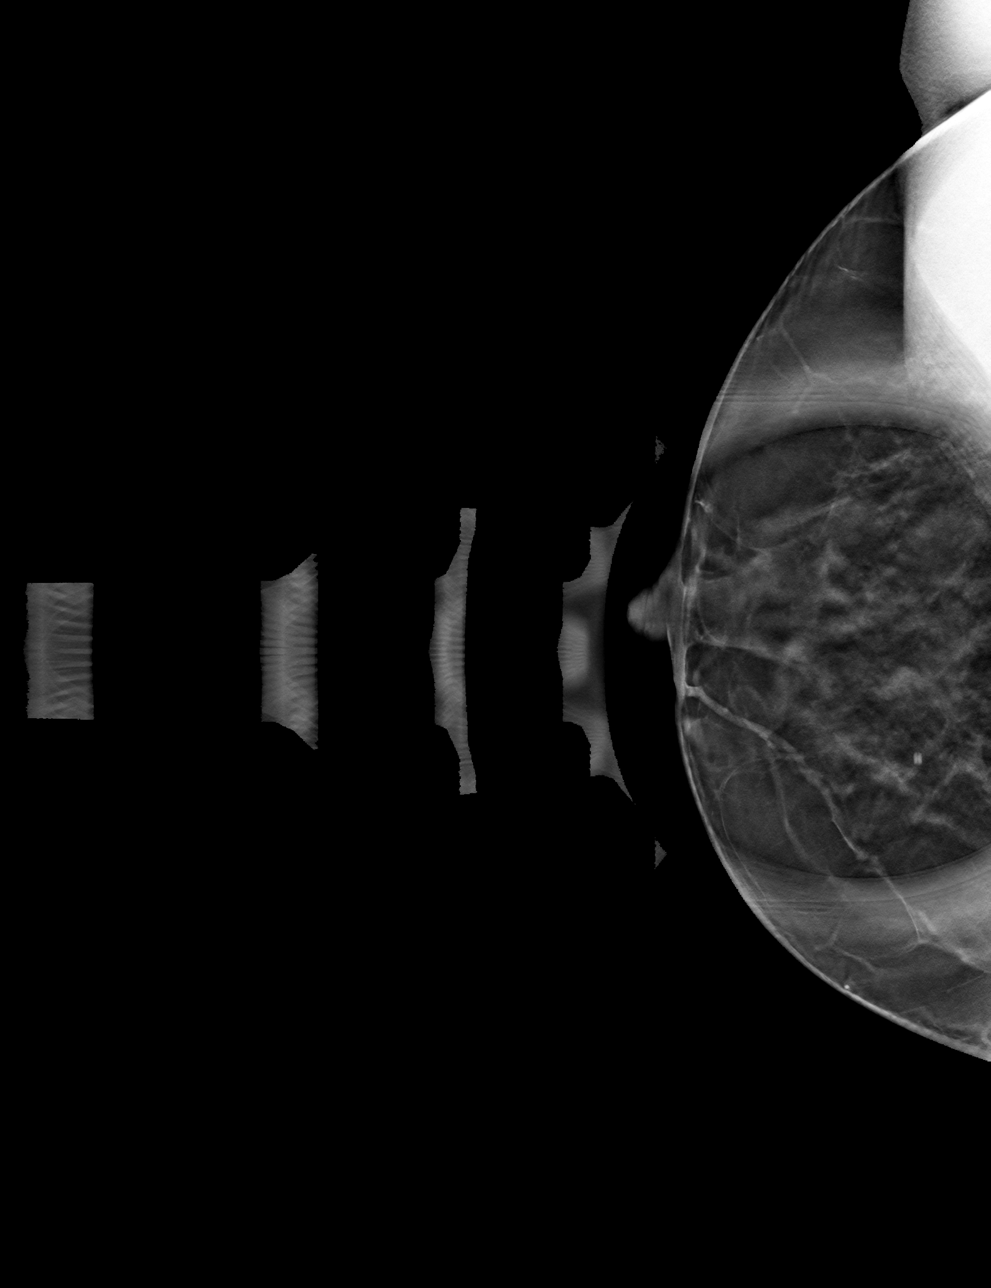

[R MLO tomo · tomo slice 28/55.0]
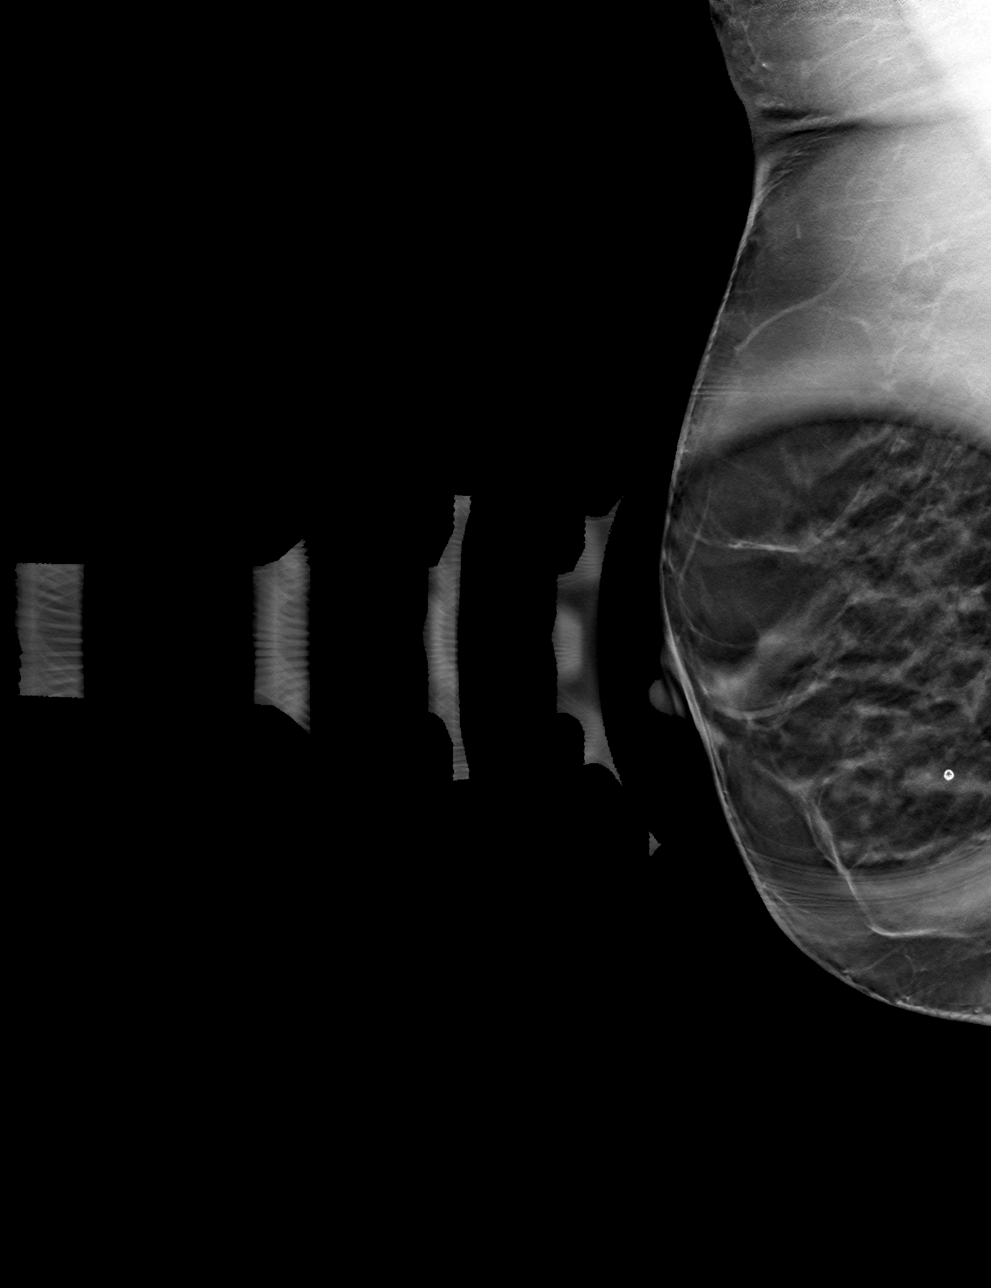

[4 of 12 positions shown; findings below may reference images not displayed]

ACR Breast Density Category c: The breast tissue is heterogeneously
dense, which may obscure small masses.
FINDINGS: On the diagnostic spot-compression imaging, the possible mass
disperses consistent with superimposed fibroglandular tissue. There
is no underlying mass or significant asymmetry. There are no areas
of architectural distortion and no suspicious calcifications.

Mammographic images were processed with CAD.
IMPRESSION: Negative exam.  No evidence of breast malignancy.

RECOMMENDATION:
Screening mammogram in one year.(Code:[id number])

I have discussed the findings and recommendations with the patient.
Results were also provided in writing at the conclusion of the
visit. If applicable, a reminder letter will be sent to the patient
regarding the next appointment.

BI-RADS CATEGORY  1: Negative.

## 2020-05-06 ENCOUNTER — Ambulatory Visit: Payer: 59 | Admitting: Family Medicine

## 2020-05-14 ENCOUNTER — Ambulatory Visit: Payer: Self-pay | Admitting: Family Medicine

## 2020-05-14 ENCOUNTER — Other Ambulatory Visit: Payer: Self-pay

## 2020-05-14 ENCOUNTER — Encounter: Payer: Self-pay | Admitting: Family Medicine

## 2020-05-14 VITALS — BP 124/78 | HR 53 | Temp 97.9°F | Resp 16 | Ht 62.0 in | Wt 130.2 lb

## 2020-05-14 DIAGNOSIS — I10 Essential (primary) hypertension: Secondary | ICD-10-CM

## 2020-05-14 DIAGNOSIS — E039 Hypothyroidism, unspecified: Secondary | ICD-10-CM

## 2020-05-14 NOTE — Patient Instructions (Signed)
DASH Eating Plan DASH stands for "Dietary Approaches to Stop Hypertension." The DASH eating plan is a healthy eating plan that has been shown to reduce high blood pressure (hypertension). It may also reduce your risk for type 2 diabetes, heart disease, and stroke. The DASH eating plan may also help with weight loss. What are tips for following this plan?  General guidelines  Avoid eating more than 2,300 mg (milligrams) of salt (sodium) a day. If you have hypertension, you may need to reduce your sodium intake to 1,500 mg a day.  Limit alcohol intake to no more than 1 drink a day for nonpregnant women and 2 drinks a day for men. One drink equals 12 oz of beer, 5 oz of wine, or 1 oz of hard liquor.  Work with your health care provider to maintain a healthy body weight or to lose weight. Ask what an ideal weight is for you.  Get at least 30 minutes of exercise that causes your heart to beat faster (aerobic exercise) most days of the week. Activities may include walking, swimming, or biking.  Work with your health care provider or diet and nutrition specialist (dietitian) to adjust your eating plan to your individual calorie needs. Reading food labels   Check food labels for the amount of sodium per serving. Choose foods with less than 5 percent of the Daily Value of sodium. Generally, foods with less than 300 mg of sodium per serving fit into this eating plan.  To find whole grains, look for the word "whole" as the first word in the ingredient list. Shopping  Buy products labeled as "low-sodium" or "no salt added."  Buy fresh foods. Avoid canned foods and premade or frozen meals. Cooking  Avoid adding salt when cooking. Use salt-free seasonings or herbs instead of table salt or sea salt. Check with your health care provider or pharmacist before using salt substitutes.  Do not fry foods. Cook foods using healthy methods such as baking, boiling, grilling, and broiling instead.  Cook with  heart-healthy oils, such as olive, canola, soybean, or sunflower oil. Meal planning  Eat a balanced diet that includes: ? 5 or more servings of fruits and vegetables each day. At each meal, try to fill half of your plate with fruits and vegetables. ? Up to 6-8 servings of whole grains each day. ? Less than 6 oz of lean meat, poultry, or fish each day. A 3-oz serving of meat is about the same size as a deck of cards. One egg equals 1 oz. ? 2 servings of low-fat dairy each day. ? A serving of nuts, seeds, or beans 5 times each week. ? Heart-healthy fats. Healthy fats called Omega-3 fatty acids are found in foods such as flaxseeds and coldwater fish, like sardines, salmon, and mackerel.  Limit how much you eat of the following: ? Canned or prepackaged foods. ? Food that is high in trans fat, such as fried foods. ? Food that is high in saturated fat, such as fatty meat. ? Sweets, desserts, sugary drinks, and other foods with added sugar. ? Full-fat dairy products.  Do not salt foods before eating.  Try to eat at least 2 vegetarian meals each week.  Eat more home-cooked food and less restaurant, buffet, and fast food.  When eating at a restaurant, ask that your food be prepared with less salt or no salt, if possible. What foods are recommended? The items listed may not be a complete list. Talk with your dietitian about   what dietary choices are best for you. Grains Whole-grain or whole-wheat bread. Whole-grain or whole-wheat pasta. Brown rice. Oatmeal. Quinoa. Bulgur. Whole-grain and low-sodium cereals. Pita bread. Low-fat, low-sodium crackers. Whole-wheat flour tortillas. Vegetables Fresh or frozen vegetables (raw, steamed, roasted, or grilled). Low-sodium or reduced-sodium tomato and vegetable juice. Low-sodium or reduced-sodium tomato sauce and tomato paste. Low-sodium or reduced-sodium canned vegetables. Fruits All fresh, dried, or frozen fruit. Canned fruit in natural juice (without  added sugar). Meat and other protein foods Skinless chicken or turkey. Ground chicken or turkey. Pork with fat trimmed off. Fish and seafood. Egg whites. Dried beans, peas, or lentils. Unsalted nuts, nut butters, and seeds. Unsalted canned beans. Lean cuts of beef with fat trimmed off. Low-sodium, lean deli meat. Dairy Low-fat (1%) or fat-free (skim) milk. Fat-free, low-fat, or reduced-fat cheeses. Nonfat, low-sodium ricotta or cottage cheese. Low-fat or nonfat yogurt. Low-fat, low-sodium cheese. Fats and oils Soft margarine without trans fats. Vegetable oil. Low-fat, reduced-fat, or light mayonnaise and salad dressings (reduced-sodium). Canola, safflower, olive, soybean, and sunflower oils. Avocado. Seasoning and other foods Herbs. Spices. Seasoning mixes without salt. Unsalted popcorn and pretzels. Fat-free sweets. What foods are not recommended? The items listed may not be a complete list. Talk with your dietitian about what dietary choices are best for you. Grains Baked goods made with fat, such as croissants, muffins, or some breads. Dry pasta or rice meal packs. Vegetables Creamed or fried vegetables. Vegetables in a cheese sauce. Regular canned vegetables (not low-sodium or reduced-sodium). Regular canned tomato sauce and paste (not low-sodium or reduced-sodium). Regular tomato and vegetable juice (not low-sodium or reduced-sodium). Pickles. Olives. Fruits Canned fruit in a light or heavy syrup. Fried fruit. Fruit in cream or butter sauce. Meat and other protein foods Fatty cuts of meat. Ribs. Fried meat. Bacon. Sausage. Bologna and other processed lunch meats. Salami. Fatback. Hotdogs. Bratwurst. Salted nuts and seeds. Canned beans with added salt. Canned or smoked fish. Whole eggs or egg yolks. Chicken or turkey with skin. Dairy Whole or 2% milk, cream, and half-and-half. Whole or full-fat cream cheese. Whole-fat or sweetened yogurt. Full-fat cheese. Nondairy creamers. Whipped toppings.  Processed cheese and cheese spreads. Fats and oils Butter. Stick margarine. Lard. Shortening. Ghee. Bacon fat. Tropical oils, such as coconut, palm kernel, or palm oil. Seasoning and other foods Salted popcorn and pretzels. Onion salt, garlic salt, seasoned salt, table salt, and sea salt. Worcestershire sauce. Tartar sauce. Barbecue sauce. Teriyaki sauce. Soy sauce, including reduced-sodium. Steak sauce. Canned and packaged gravies. Fish sauce. Oyster sauce. Cocktail sauce. Horseradish that you find on the shelf. Ketchup. Mustard. Meat flavorings and tenderizers. Bouillon cubes. Hot sauce and Tabasco sauce. Premade or packaged marinades. Premade or packaged taco seasonings. Relishes. Regular salad dressings. Where to find more information:  National Heart, Lung, and Blood Institute: www.nhlbi.nih.gov  American Heart Association: www.heart.org Summary  The DASH eating plan is a healthy eating plan that has been shown to reduce high blood pressure (hypertension). It may also reduce your risk for type 2 diabetes, heart disease, and stroke.  With the DASH eating plan, you should limit salt (sodium) intake to 2,300 mg a day. If you have hypertension, you may need to reduce your sodium intake to 1,500 mg a day.  When on the DASH eating plan, aim to eat more fresh fruits and vegetables, whole grains, lean proteins, low-fat dairy, and heart-healthy fats.  Work with your health care provider or diet and nutrition specialist (dietitian) to adjust your eating plan to your   individual calorie needs. This information is not intended to replace advice given to you by your health care provider. Make sure you discuss any questions you have with your health care provider. Document Revised: 10/15/2017 Document Reviewed: 10/26/2016 Elsevier Patient Education  2020 Elsevier Inc.  

## 2020-05-14 NOTE — Progress Notes (Signed)
Patient ID: Megan Escobar, female    DOB: December 21, 1970  Age: 49 y.o. MRN: [id number]    Subjective:  Subjective  HPI JULIONA VALES presents for f/u bp and thyroid---- no complaints   Review of Systems  Constitutional: Negative for appetite change, diaphoresis, fatigue and unexpected weight change.  Eyes: Negative for pain, redness and visual disturbance.  Respiratory: Negative for cough, chest tightness, shortness of breath and wheezing.   Cardiovascular: Negative for chest pain, palpitations and leg swelling.  Endocrine: Negative for cold intolerance, heat intolerance, polydipsia, polyphagia and polyuria.  Genitourinary: Negative for difficulty urinating, dysuria and frequency.  Neurological: Negative for dizziness, light-headedness, numbness and headaches.    History Past Medical History:  Diagnosis Date  . HTN (hypertension)   . Hyperlipidemia   . Raynaud's disease   . Thyroid disease    Hypothyroid    She has a past surgical history that includes Cesarean section and Myomectomy.   Her family history includes Breast cancer in her paternal aunt and paternal grandmother; Coronary artery disease in her father and mother; Heart disease in her father; Heart disease (age of onset: 33) in her mother; Hyperlipidemia in her father and mother; Hypertension in her father and mother; Irritable bowel syndrome in her mother.She reports that she has never smoked. She has never used smokeless tobacco. She reports that she does not drink alcohol and does not use drugs.  Current Outpatient Medications on File Prior to Visit  Medication Sig Dispense Refill  . amLODipine (NORVASC) 2.5 MG tablet Take 1 tablet (2.5 mg total) by mouth daily. 90 tablet 3  . CAMILA 0.35 MG tablet Take 1 tablet by mouth daily.     Marland Kitchen levothyroxine (SYNTHROID) 100 MCG tablet Take 1 tablet (100 mcg total) by mouth daily before breakfast. 90 tablet 3  . lisinopril (ZESTRIL) 10 MG tablet Take 1 tablet (10 mg total) by  mouth daily. 90 tablet 3  . Probiotic Product (ALIGN) 4 MG CAPS Take 1 capsule (4 mg total) by mouth daily. 56 capsule 11   Current Facility-Administered Medications on File Prior to Visit  Medication Dose Route Frequency Provider Last Rate Last Admin  . hyoscyamine (LEVSIN SL) SL tablet 0.125 mg  0.125 mg Oral Q4H PRN Roma Schanz R, DO         Objective:  Objective  Physical Exam Vitals reviewed.  Constitutional:      Appearance: She is well-developed.  HENT:     Head: Normocephalic and atraumatic.  Eyes:     Conjunctiva/sclera: Conjunctivae normal.  Neck:     Thyroid: No thyromegaly.     Vascular: No carotid bruit or JVD.  Cardiovascular:     Rate and Rhythm: Normal rate and regular rhythm.     Heart sounds: Normal heart sounds. No murmur heard.   Pulmonary:     Effort: Pulmonary effort is normal. No respiratory distress.     Breath sounds: Normal breath sounds. No wheezing or rales.  Chest:     Chest wall: No tenderness.  Musculoskeletal:     Cervical back: Normal range of motion and neck supple.  Neurological:     Mental Status: She is alert and oriented to person, place, and time.    BP 124/78 (BP Location: Right Arm, Patient Position: Sitting, Cuff Size: Normal)   Pulse (!) 53   Temp 97.9 F (36.6 C) (Temporal)   Resp 16   Ht 5\' 2"  (1.575 m)   Wt 130 lb 3.2 oz (59.1  kg)   HC 18" (45.7 cm)   BMI 23.81 kg/m  Wt Readings from Last 3 Encounters:  05/14/20 130 lb 3.2 oz (59.1 kg)  11/03/19 131 lb 12.8 oz (59.8 kg)  08/30/18 129 lb 3.2 oz (58.6 kg)     Lab Results  Component Value Date   WBC 7.6 11/03/2019   HGB 14.3 11/03/2019   HCT 43.1 11/03/2019   PLT 275.0 11/03/2019   GLUCOSE 114 (H) 11/03/2019   CHOL 150 11/03/2019   TRIG 37.0 11/03/2019   HDL 64.00 11/03/2019   LDLCALC 78 11/03/2019   ALT 31 11/03/2019   AST 25 11/03/2019   NA 137 11/03/2019   K 3.9 11/03/2019   CL 102 11/03/2019   CREATININE 0.89 11/03/2019   BUN 12 11/03/2019    CO2 30 11/03/2019   TSH 1.24 11/03/2019   HGBA1C 5.5 09/15/2016    MM DIAG BREAST TOMO UNI RIGHT  Result Date: 06/06/2019 CLINICAL DATA:  Screening recall for a possible right breast mass. EXAM: DIGITAL DIAGNOSTIC UNILATERAL RIGHT MAMMOGRAM WITH CAD AND TOMO COMPARISON:  Previous exam(s). ACR Breast Density Category c: The breast tissue is heterogeneously dense, which may obscure small masses. FINDINGS: On the diagnostic spot-compression imaging, the possible mass disperses consistent with superimposed fibroglandular tissue. There is no underlying mass or significant asymmetry. There are no areas of architectural distortion and no suspicious calcifications. Mammographic images were processed with CAD. IMPRESSION: Negative exam.  No evidence of breast malignancy. RECOMMENDATION: Screening mammogram in one year.(Code:[id number]) I have discussed the findings and recommendations with the patient. Results were also provided in writing at the conclusion of the visit. If applicable, a reminder letter will be sent to the patient regarding the next appointment. BI-RADS CATEGORY  1: Negative. Electronically Signed   By: Lajean Manes M.D.   On: 06/06/2019 13:03     Assessment & Plan:  Plan  I am having Takeela B. Ashby maintain her Camila, amLODipine, levothyroxine, lisinopril, and Align. We will continue to administer hyoscyamine.  No orders of the defined types were placed in this encounter.   Problem List Items Addressed This Visit      Unprioritized   Essential hypertension - Primary    Well controlled, no changes to meds. Encouraged heart healthy diet such as the DASH diet and exercise as tolerated.       Relevant Orders   Comprehensive metabolic panel   Lipid panel   Hypothyroidism    Check labs  con't meds stable      Relevant Orders   TSH      Follow-up: Return in about 6 months (around 11/13/2020), or if symptoms worsen or fail to improve, for annual exam, fasting.  Ann Held, DO

## 2020-05-14 NOTE — Assessment & Plan Note (Signed)
Check labs  con't meds stable

## 2020-05-14 NOTE — Assessment & Plan Note (Signed)
Well controlled, no changes to meds. Encouraged heart healthy diet such as the DASH diet and exercise as tolerated.  °

## 2020-05-15 LAB — LIPID PANEL
Cholesterol: 154 mg/dL (ref 0–200)
HDL: 63 mg/dL (ref >39.00)
LDL Cholesterol: 83 mg/dL (ref 0–99)
NonHDL: 91.4
Total CHOL/HDL Ratio: 2
Triglycerides: 43 mg/dL (ref 0.0–149.0)
VLDL: 8.6 mg/dL (ref 0.0–40.0)

## 2020-05-15 LAB — COMPREHENSIVE METABOLIC PANEL
ALT: 17 U/L (ref 0–35)
AST: 17 U/L (ref 0–37)
Albumin: 4.5 g/dL (ref 3.5–5.2)
Alkaline Phosphatase: 71 U/L (ref 39–117)
BUN: 9 mg/dL (ref 6–23)
CO2: 27 mEq/L (ref 19–32)
Calcium: 9.3 mg/dL (ref 8.4–10.5)
Chloride: 101 mEq/L (ref 96–112)
Creatinine, Ser: 0.84 mg/dL (ref 0.40–1.20)
GFR: 72.18 mL/min (ref >60.00)
Glucose, Bld: 95 mg/dL (ref 70–99)
Potassium: 4.1 mEq/L (ref 3.5–5.1)
Sodium: 136 mEq/L (ref 135–145)
Total Bilirubin: 0.5 mg/dL (ref 0.2–1.2)
Total Protein: 7.3 g/dL (ref 6.0–8.3)

## 2020-05-15 LAB — TSH: TSH: 1.61 u[IU]/mL (ref 0.35–4.50)

## 2020-06-18 DIAGNOSIS — Z6823 Body mass index (BMI) 23.0-23.9, adult: Secondary | ICD-10-CM | POA: Diagnosis not present

## 2020-06-18 DIAGNOSIS — Z1231 Encounter for screening mammogram for malignant neoplasm of breast: Secondary | ICD-10-CM | POA: Diagnosis not present

## 2020-06-18 DIAGNOSIS — Z01419 Encounter for gynecological examination (general) (routine) without abnormal findings: Secondary | ICD-10-CM | POA: Diagnosis not present

## 2020-06-18 DIAGNOSIS — N951 Menopausal and female climacteric states: Secondary | ICD-10-CM | POA: Diagnosis not present

## 2020-08-15 ENCOUNTER — Telehealth: Payer: Self-pay | Admitting: Family Medicine

## 2020-08-15 NOTE — Telephone Encounter (Signed)
Caller Megan Escobar Call Back # [phone]  Patient states she would like $ 50.00 no show fee removed .For a appointment 05/06/2020

## 2020-08-28 NOTE — Telephone Encounter (Signed)
I sent an email to [employer] requesting the no show fee be removed for DOS 05/06/2020.

## 2020-09-12 DIAGNOSIS — Z03818 Encounter for observation for suspected exposure to other biological agents ruled out: Secondary | ICD-10-CM | POA: Diagnosis not present

## 2020-10-29 ENCOUNTER — Other Ambulatory Visit: Payer: Self-pay | Admitting: *Deleted

## 2020-10-29 DIAGNOSIS — I10 Essential (primary) hypertension: Secondary | ICD-10-CM

## 2020-10-29 MED ORDER — LISINOPRIL 10 MG PO TABS: 10.0000 mg | ORAL_TABLET | Freq: Every day | ORAL | 3 refills | Status: DC

## 2020-11-19 ENCOUNTER — Encounter: Payer: BLUE CROSS/BLUE SHIELD | Admitting: Family Medicine

## 2020-12-06 ENCOUNTER — Other Ambulatory Visit: Payer: Self-pay | Admitting: Family Medicine

## 2020-12-06 DIAGNOSIS — E039 Hypothyroidism, unspecified: Secondary | ICD-10-CM

## 2020-12-23 ENCOUNTER — Encounter: Payer: BLUE CROSS/BLUE SHIELD | Admitting: Family Medicine

## 2021-01-27 ENCOUNTER — Encounter: Payer: BLUE CROSS/BLUE SHIELD | Admitting: Family Medicine

## 2021-02-25 DIAGNOSIS — Z800 Family history of malignant neoplasm of digestive organs: Secondary | ICD-10-CM | POA: Diagnosis not present

## 2021-02-25 DIAGNOSIS — K580 Irritable bowel syndrome with diarrhea: Secondary | ICD-10-CM | POA: Diagnosis not present

## 2021-02-25 DIAGNOSIS — R198 Other specified symptoms and signs involving the digestive system and abdomen: Secondary | ICD-10-CM | POA: Diagnosis not present

## 2021-03-05 ENCOUNTER — Other Ambulatory Visit: Payer: Self-pay | Admitting: Family Medicine

## 2021-03-05 DIAGNOSIS — E039 Hypothyroidism, unspecified: Secondary | ICD-10-CM

## 2021-04-25 DIAGNOSIS — Z1211 Encounter for screening for malignant neoplasm of colon: Secondary | ICD-10-CM | POA: Diagnosis not present

## 2021-04-25 DIAGNOSIS — Z800 Family history of malignant neoplasm of digestive organs: Secondary | ICD-10-CM | POA: Diagnosis not present

## 2021-04-25 DIAGNOSIS — K52832 Lymphocytic colitis: Secondary | ICD-10-CM | POA: Diagnosis not present

## 2021-04-25 LAB — HM COLONOSCOPY

## 2021-05-05 ENCOUNTER — Emergency Department
Admission: RE | Admit: 2021-05-05 | Discharge: 2021-05-05 | Disposition: A | Discharge status: Home / Self Care | Payer: BLUE CROSS/BLUE SHIELD | Source: Ambulatory Visit

## 2021-05-05 ENCOUNTER — Other Ambulatory Visit: Payer: Self-pay

## 2021-05-05 VITALS — BP 125/71 | HR 55 | Temp 98.1°F | Resp 18

## 2021-05-05 DIAGNOSIS — R112 Nausea with vomiting, unspecified: Secondary | ICD-10-CM

## 2021-05-05 DIAGNOSIS — H6123 Impacted cerumen, bilateral: Secondary | ICD-10-CM | POA: Diagnosis not present

## 2021-05-05 DIAGNOSIS — R42 Dizziness and giddiness: Secondary | ICD-10-CM | POA: Diagnosis not present

## 2021-05-05 DIAGNOSIS — H6692 Otitis media, unspecified, left ear: Secondary | ICD-10-CM

## 2021-05-05 MED ORDER — ONDANSETRON HCL 4 MG PO TABS: 4.0000 mg | ORAL_TABLET | Freq: Three times a day (TID) | ORAL | 0 refills | Status: DC | PRN

## 2021-05-05 MED ORDER — ONDANSETRON 4 MG PO TBDP
4.0000 mg | ORAL_TABLET | Freq: Once | ORAL | Status: AC
  Administered 2021-05-05: 4 mg via ORAL

## 2021-05-05 MED ORDER — MECLIZINE HCL 25 MG PO TABS: 25.0000 mg | ORAL_TABLET | Freq: Three times a day (TID) | ORAL | 0 refills | Status: DC | PRN

## 2021-05-05 MED ORDER — AMOXICILLIN-POT CLAVULANATE 875-125 MG PO TABS: 1.0000 | ORAL_TABLET | Freq: Two times a day (BID) | ORAL | 0 refills | Status: DC

## 2021-05-05 NOTE — ED Provider Notes (Signed)
[facility]    CSN: [id number] Arrival date & time: 05/05/21  1551      History   Chief Complaint Chief Complaint  Patient presents with   Dizziness   Ear Fullness    HPI Megan Escobar is a 50 y.o. female.   HPI 50 year old female presents with right ear fullness and dizziness for 3 days.  Patient reports nausea and vomiting for 3 days.  PMH significant for essential hypertension, cardiac murmur, and PVCs.  Past Medical History:  Diagnosis Date   HTN (hypertension)    Hyperlipidemia    Raynaud's disease    Thyroid disease    Hypothyroid    Patient Active Problem List   Diagnosis Date Noted   Loose stools 01/28/2017   Raynaud's disease 02/19/2015   Cardiac murmur 11/24/2013   BP (high blood pressure) 11/24/2013   Beat, premature ventricular 11/24/2013   EYE FLOATERS 07/10/2010   Essential hypertension 07/10/2010   Hypothyroidism 08/09/2009   GERD 07/17/2009   GAS/BLOATING 07/17/2009   ABDOMINAL PAIN 07/17/2009    Past Surgical History:  Procedure Laterality Date   CESAREAN SECTION     x's 2   MYOMECTOMY      OB History   No obstetric history on file.      Home Medications    Prior to Admission medications   Medication Sig Start Date End Date Taking? Authorizing Provider  amoxicillin-clavulanate (AUGMENTIN) 875-125 MG tablet Take 1 tablet by mouth every 12 (twelve) hours. 05/05/21  Yes Eliezer Lofts, FNP  meclizine (ANTIVERT) 25 MG tablet Take 1 tablet (25 mg total) by mouth 3 (three) times daily as needed for dizziness. 05/05/21  Yes Eliezer Lofts, FNP  ondansetron (ZOFRAN) 4 MG tablet Take 1 tablet (4 mg total) by mouth every 8 (eight) hours as needed for nausea or vomiting. 05/05/21  Yes Eliezer Lofts, FNP  amLODipine (NORVASC) 2.5 MG tablet Take 1 tablet (2.5 mg total) by mouth daily. 11/03/19   Roma Schanz R, DO  CAMILA 0.35 MG tablet Take 1 tablet by mouth daily.  11/05/11   [provider]  levothyroxine  (SYNTHROID) 100 MCG tablet Take 1 tablet (100 mcg total) by mouth daily before breakfast. 03/05/21   Carollee Herter, Alferd Apa, DO  lisinopril (ZESTRIL) 10 MG tablet Take 1 tablet (10 mg total) by mouth daily. 10/29/20   Ann Held, DO  Probiotic Product (ALIGN) 4 MG CAPS Take 1 capsule (4 mg total) by mouth daily. 11/03/19   Ann Held, DO    Family History Family History  Problem Relation Age of Onset   Irritable bowel syndrome Mother    Heart disease Mother 30       cabg   Hypertension Mother    Hyperlipidemia Mother    Coronary artery disease Mother    Heart disease Father        stent   Hypertension Father    Hyperlipidemia Father    Coronary artery disease Father    Breast cancer Paternal Aunt    Breast cancer Paternal Grandmother     Social History Social History   Tobacco Use   Smoking status: Never   Smokeless tobacco: Never  Substance Use Topics   Alcohol use: No   Drug use: No     Allergies   Patient has no known allergies.   Review of Systems Review of Systems  Constitutional: Negative.   HENT:         Ear fullness  Eyes: Negative.   Respiratory: Negative.    Cardiovascular: Negative.   Gastrointestinal: Negative.   Musculoskeletal: Negative.   Skin: Negative.   Neurological:  Positive for dizziness.    Physical Exam Triage Vital Signs ED Triage Vitals  Enc Vitals Group     BP 05/05/21 1609 125/71     Pulse Rate 05/05/21 1609 (!) 55     Resp 05/05/21 1609 18     Temp 05/05/21 1609 98.1 F (36.7 C)     Temp Source 05/05/21 1609 Oral     SpO2 05/05/21 1609 99 %     Weight --      Height --      Head Circumference --      Peak Flow --      Pain Score 05/05/21 1610 0     Pain Loc --      Pain Edu? --      Excl. in [place]? --    No data found.  Updated Vital Signs BP 125/71 (BP Location: Right Arm)   Pulse (!) 55   Temp 98.1 F (36.7 C) (Oral)   Resp 18   LMP  (LMP Unknown)   SpO2 99%   Physical  Exam Constitutional:      General: She is not in acute distress.    Appearance: Normal appearance. She is normal weight. She is ill-appearing.  HENT:     Head: Normocephalic and atraumatic.     Right Ear: There is impacted cerumen.     Left Ear: There is impacted cerumen.     Ears:     Comments: Bilateral EACs impacted with cerumen unable to visualize either TM.  Post second ear lavage: EAC's are clear bilaterally, right TM-clear, retracted with good light reflex; left TM-erythematous, bulging    Mouth/Throat:     Mouth: Mucous membranes are moist.     Pharynx: Oropharynx is clear.  Eyes:     Extraocular Movements: Extraocular movements intact.     Conjunctiva/sclera: Conjunctivae normal.     Pupils: Pupils are equal, round, and reactive to light.  Cardiovascular:     Rate and Rhythm: Normal rate and regular rhythm.     Pulses: Normal pulses.     Heart sounds: Murmur heard.  Pulmonary:     Effort: Pulmonary effort is normal.     Breath sounds: Normal breath sounds.     Comments: No adventitious breath sounds Musculoskeletal:        General: Normal range of motion.     Cervical back: Normal range of motion and neck supple. No tenderness.  Lymphadenopathy:     Cervical: No cervical adenopathy.  Skin:    General: Skin is warm and dry.  Neurological:     General: No focal deficit present.     Mental Status: She is alert and oriented to person, place, and time.  Psychiatric:        Mood and Affect: Mood normal.        Behavior: Behavior normal.     UC Treatments / Results  Labs (all labs ordered are listed, but only abnormal results are displayed) Labs Reviewed  COVID-19, FLU A+B NAA    EKG   Radiology No results found.  Procedures Procedures (including critical care time)  Medications Ordered in UC Medications  ondansetron (ZOFRAN-ODT) disintegrating tablet 4 mg (4 mg Oral Given 05/05/21 1756)    Initial Impression / Assessment and Plan / UC Course  I have  reviewed the triage vital  signs and the nursing notes.  Pertinent labs & imaging results that were available during my care of the patient were reviewed by me and considered in my medical decision making (see chart for details).     MDM: 1.  Bilateral cerumen impaction, 2.  Dizziness, 3.  Nausea and vomiting, 4. Left acute otitis media.  Discharged home, hemodynamically stable. Final Clinical Impressions(s) / UC Diagnoses   Final diagnoses:  Dizziness  Bilateral impacted cerumen  Intractable vomiting with nausea, unspecified vomiting type  Acute left otitis media     Discharge Instructions      Advised patient to take medication as directed with food to completion.  Encouraged patient to increase daily water intake while taking this medication.  Advised patient may take Meclizine daily, as needed.     ED Prescriptions     Medication Sig Dispense Auth. Provider   amoxicillin-clavulanate (AUGMENTIN) 875-125 MG tablet Take 1 tablet by mouth every 12 (twelve) hours. 14 tablet Eliezer Lofts, FNP   meclizine (ANTIVERT) 25 MG tablet Take 1 tablet (25 mg total) by mouth 3 (three) times daily as needed for dizziness. 30 tablet Eliezer Lofts, FNP   ondansetron (ZOFRAN) 4 MG tablet Take 1 tablet (4 mg total) by mouth every 8 (eight) hours as needed for nausea or vomiting. 20 tablet Eliezer Lofts, FNP      PDMP not reviewed this encounter.   Eliezer Lofts, [place] 05/05/21 [phone]

## 2021-05-05 NOTE — ED Triage Notes (Signed)
Pt c/o RT ear fullness since Friday. Tried debrox. Started having dizziness on Saturday. Nausea with the dizziness.

## 2021-05-05 NOTE — Discharge Instructions (Addendum)
Advised patient to take medication as directed with food to completion.  Encouraged patient to increase daily water intake while taking this medication.  Advised patient may take Meclizine daily, as needed.

## 2021-05-07 ENCOUNTER — Telehealth: Payer: Self-pay | Admitting: Emergency Medicine

## 2021-05-07 NOTE — Telephone Encounter (Signed)
Patient called requesting her COVID results.  Patient advised that her results are still in process.  Once the results are available, they will be posted in her mychart.  Patient voices understanding.

## 2021-05-08 DIAGNOSIS — R519 Headache, unspecified: Secondary | ICD-10-CM | POA: Diagnosis not present

## 2021-05-08 LAB — COVID-19, FLU A+B NAA
Influenza A, NAA: NOT DETECTED
Influenza B, NAA: NOT DETECTED
SARS-CoV-2, NAA: NOT DETECTED

## 2021-05-22 ENCOUNTER — Other Ambulatory Visit: Payer: Self-pay

## 2021-05-22 ENCOUNTER — Encounter: Payer: Self-pay | Admitting: Family Medicine

## 2021-05-22 ENCOUNTER — Ambulatory Visit (INDEPENDENT_AMBULATORY_CARE_PROVIDER_SITE_OTHER): Payer: BLUE CROSS/BLUE SHIELD | Admitting: Family Medicine

## 2021-05-22 VITALS — BP 110/72 | HR 63 | Temp 99.3°F | Resp 18 | Ht 62.0 in | Wt 128.4 lb

## 2021-05-22 DIAGNOSIS — I10 Essential (primary) hypertension: Secondary | ICD-10-CM | POA: Diagnosis not present

## 2021-05-22 DIAGNOSIS — Z0000 Encounter for general adult medical examination without abnormal findings: Secondary | ICD-10-CM | POA: Diagnosis not present

## 2021-05-22 DIAGNOSIS — H8110 Benign paroxysmal vertigo, unspecified ear: Secondary | ICD-10-CM | POA: Insufficient documentation

## 2021-05-22 DIAGNOSIS — E039 Hypothyroidism, unspecified: Secondary | ICD-10-CM

## 2021-05-22 DIAGNOSIS — Z1159 Encounter for screening for other viral diseases: Secondary | ICD-10-CM

## 2021-05-22 DIAGNOSIS — K52832 Lymphocytic colitis: Secondary | ICD-10-CM | POA: Insufficient documentation

## 2021-05-22 LAB — CBC WITH DIFFERENTIAL/PLATELET
Basophils Absolute: 0 10*3/uL (ref 0.0–0.1)
Basophils Relative: 0.6 % (ref 0.0–3.0)
Eosinophils Absolute: 0 10*3/uL (ref 0.0–0.7)
Eosinophils Relative: 0.8 % (ref 0.0–5.0)
HCT: 42.2 % (ref 36.0–46.0)
Hemoglobin: 14.4 g/dL (ref 12.0–15.0)
Lymphocytes Relative: 23.2 % (ref 12.0–46.0)
Lymphs Abs: 1.3 10*3/uL (ref 0.7–4.0)
MCHC: 34.1 g/dL (ref 30.0–36.0)
MCV: 89.3 fl (ref 78.0–100.0)
Monocytes Absolute: 0.5 10*3/uL (ref 0.1–1.0)
Monocytes Relative: 8.3 % (ref 3.0–12.0)
Neutro Abs: 3.8 10*3/uL (ref 1.4–7.7)
Neutrophils Relative %: 67.1 % (ref 43.0–77.0)
Platelets: 266 10*3/uL (ref 150.0–400.0)
RBC: 4.73 Mil/uL (ref 3.87–5.11)
RDW: 13.5 % (ref 11.5–15.5)
WBC: 5.7 10*3/uL (ref 4.0–10.5)

## 2021-05-22 LAB — COMPREHENSIVE METABOLIC PANEL WITH GFR
ALT: 28 U/L (ref 0–35)
AST: 21 U/L (ref 0–37)
Albumin: 4.5 g/dL (ref 3.5–5.2)
Alkaline Phosphatase: 67 U/L (ref 39–117)
BUN: 8 mg/dL (ref 6–23)
CO2: 29 meq/L (ref 19–32)
Calcium: 9.3 mg/dL (ref 8.4–10.5)
Chloride: 99 meq/L (ref 96–112)
Creatinine, Ser: 0.82 mg/dL (ref 0.40–1.20)
GFR: 83.88 mL/min
Glucose, Bld: 105 mg/dL — ABNORMAL HIGH (ref 70–99)
Potassium: 4.5 meq/L (ref 3.5–5.1)
Sodium: 136 meq/L (ref 135–145)
Total Bilirubin: 0.5 mg/dL (ref 0.2–1.2)
Total Protein: 7.5 g/dL (ref 6.0–8.3)

## 2021-05-22 LAB — LIPID PANEL
Cholesterol: 205 mg/dL — ABNORMAL HIGH (ref 0–200)
HDL: 84.5 mg/dL
LDL Cholesterol: 113 mg/dL — ABNORMAL HIGH (ref 0–99)
NonHDL: 120.9
Total CHOL/HDL Ratio: 2
Triglycerides: 38 mg/dL (ref 0.0–149.0)
VLDL: 7.6 mg/dL (ref 0.0–40.0)

## 2021-05-22 LAB — TSH: TSH: 1.5 u[IU]/mL (ref 0.35–5.50)

## 2021-05-22 MED ORDER — LEVOTHYROXINE SODIUM 100 MCG PO TABS: 100.0000 ug | ORAL_TABLET | Freq: Every day | ORAL | 3 refills | Status: DC

## 2021-05-22 MED ORDER — LISINOPRIL 10 MG PO TABS: 10.0000 mg | ORAL_TABLET | Freq: Every day | ORAL | 3 refills | Status: DC

## 2021-05-22 NOTE — Assessment & Plan Note (Signed)
Per GI

## 2021-05-22 NOTE — Assessment & Plan Note (Signed)
Check labs today Refill meds

## 2021-05-22 NOTE — Assessment & Plan Note (Signed)
Per ent  Pt is doing epley manuvers at home

## 2021-05-22 NOTE — Assessment & Plan Note (Signed)
ghm utd Check labsl  Pt refusing tdap -- she said last one she had she had trouble with

## 2021-05-22 NOTE — Assessment & Plan Note (Signed)
Well controlled, no changes to meds. Encouraged heart healthy diet such as the DASH diet and exercise as tolerated.  °

## 2021-05-22 NOTE — Patient Instructions (Signed)
Preventive Care 50-50 Years Old, Female Preventive care refers to lifestyle choices and visits with your health care provider that can promote health and wellness. This includes: A yearly physical exam. This is also called an annual wellness visit. Regular dental and eye exams. Immunizations. Screening for certain conditions. Healthy lifestyle choices, such as: Eating a healthy diet. Getting regular exercise. Not using drugs or products that contain nicotine and tobacco. Limiting alcohol use. What can I expect for my preventive care visit? Physical exam Your health care provider will check your: Height and weight. These may be used to calculate your BMI (body mass index). BMI is a measurement that tells if you are at a healthy weight. Heart rate and blood pressure. Body temperature. Skin for abnormal spots. Counseling Your health care provider may ask you questions about your: Past medical problems. Family's medical history. Alcohol, tobacco, and drug use. Emotional well-being. Home life and relationship well-being. Sexual activity. Diet, exercise, and sleep habits. Work and work Statistician. Access to firearms. Method of birth control. Menstrual cycle. Pregnancy history. What immunizations do I need?  Vaccines are usually given at various ages, according to a schedule. Your health care provider will recommend vaccines for you based on your age, medicalhistory, and lifestyle or other factors, such as travel or where you work. What tests do I need? Blood tests Lipid and cholesterol levels. These may be checked every 5 years, or more often if you are over 70 years old. Hepatitis C test. Hepatitis B test. Screening Lung cancer screening. You may have this screening every year starting at age 37 if you have a 30-pack-year history of smoking and currently smoke or have quit within the past 15 years. Colorectal cancer screening. All adults should have this screening starting at  age 73 and continuing until age 66. Your health care provider may recommend screening at age 58 if you are at increased risk. You will have tests every 1-10 years, depending on your results and the type of screening test. Diabetes screening. This is done by checking your blood sugar (glucose) after you have not eaten for a while (fasting). You may have this done every 1-3 years. Mammogram. This may be done every 1-2 years. Talk with your health care provider about when you should start having regular mammograms. This may depend on whether you have a family history of breast cancer. BRCA-related cancer screening. This may be done if you have a family history of breast, ovarian, tubal, or peritoneal cancers. Pelvic exam and Pap test. This may be done every 3 years starting at age 49. Starting at age 79, this may be done every 5 years if you have a Pap test in combination with an HPV test. Other tests STD (sexually transmitted disease) testing, if you are at risk. Bone density scan. This is done to screen for osteoporosis. You may have this scan if you are at high risk for osteoporosis. Talk with your health care provider about your test results, treatment options,and if necessary, the need for more tests. Follow these instructions at home: Eating and drinking  Eat a diet that includes fresh fruits and vegetables, whole grains, lean protein, and low-fat dairy products. Take vitamin and mineral supplements as recommended by your health care provider. Do not drink alcohol if: Your health care provider tells you not to drink. You are pregnant, may be pregnant, or are planning to become pregnant. If you drink alcohol: Limit how much you have to 0-1 drink a day. Be aware  of how much alcohol is in your drink. In the [place] one drink equals one 12 oz bottle of beer (355 mL), one 5 oz glass of wine (148 mL), or one 1 oz glass of hard liquor (44 mL).  Lifestyle Take daily care of your teeth and  gums. Brush your teeth every morning and night with fluoride toothpaste. Floss one time each day. Stay active. Exercise for at least 30 minutes 5 or more days each week. Do not use any products that contain nicotine or tobacco, such as cigarettes, e-cigarettes, and chewing tobacco. If you need help quitting, ask your health care provider. Do not use drugs. If you are sexually active, practice safe sex. Use a condom or other form of protection to prevent STIs (sexually transmitted infections). If you do not wish to become pregnant, use a form of birth control. If you plan to become pregnant, see your health care provider for a prepregnancy visit. If told by your health care provider, take low-dose aspirin daily starting at age 71. Find healthy ways to cope with stress, such as: Meditation, yoga, or listening to music. Journaling. Talking to a trusted person. Spending time with friends and family. Safety Always wear your seat belt while driving or riding in a vehicle. Do not drive: If you have been drinking alcohol. Do not ride with someone who has been drinking. When you are tired or distracted. While texting. Wear a helmet and other protective equipment during sports activities. If you have firearms in your house, make sure you follow all gun safety procedures. What's next? Visit your health care provider once a year for an annual wellness visit. Ask your health care provider how often you should have your eyes and teeth checked. Stay up to date on all vaccines. This information is not intended to replace advice given to you by your health care provider. Make sure you discuss any questions you have with your healthcare provider. Document Revised: 08/06/2020 Document Reviewed: 07/14/2018 Elsevier Patient Education  2022 [employer].

## 2021-05-22 NOTE — Progress Notes (Addendum)
Subjective:   By signing my name below, I, Shehryar Baig, attest that this documentation has been prepared under the direction and in the presence of Dr. Roma Schanz, DO. 05/22/2021     Patient ID: Megan Escobar, female    DOB: 1971/07/21, 50 y.o.   MRN: [id number]  Chief Complaint  Patient presents with   Annual Exam    Pt fasting    HPI Patient is in today for a comprehensive physical exam. She is requesting a refill on 10 mg lisinopril daily PO and 100 mcg synthroid daily PO. She reports having no new symptoms related to her thyroid since her last visit. She continues having abdominal issues but is being managed by her GI specialist, Dr. Ronald Lobo, MD, and has an upcoming appointment on 05/23/2021. She denies having any fever, ear pain, congestion, sinus pain, sore throat, eye pain, chest pain, palpations, cough, SOB, wheezing, n/v/d, constipation, blood in stool, dysuria, frequency, hematuria, or headaches at this time. She continues seeing her GYN specialist to complete her pap smear and mammogram procedures. She has an upcoming appointment with them in September, 2022.She is due for a tetanus vaccine but is not willing to get it at this time. She has 3 pfizer Covid-19 vaccines at this time but did not bring her Covid-19 vaccine card at this time. She has no recent changes to her family medical history. She usually participates in exercises regularly but has stopped 2 weeks ago due to an episode of vertigo while exercising. She saw her ENT specialist to manage this issue.  She is UTD on dental care at this time.  Past Medical History:  Diagnosis Date   HTN (hypertension)    Hyperlipidemia    Raynaud's disease    Thyroid disease    Hypothyroid    Past Surgical History:  Procedure Laterality Date   CESAREAN SECTION     x's 2   MYOMECTOMY      Family History  Problem Relation Age of Onset   Irritable bowel syndrome Mother    Heart disease Mother 78        cabg   Hypertension Mother    Hyperlipidemia Mother    Coronary artery disease Mother    Heart disease Father        stent   Hypertension Father    Hyperlipidemia Father    Coronary artery disease Father    Breast cancer Paternal Aunt    Breast cancer Paternal Grandmother     Social History   Socioeconomic History   Marital status: Married    Spouse name: Not on file   Number of children: Not on file   Years of education: Not on file   Highest education level: Not on file  Occupational History   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[employer]  Tobacco Use   Smoking status: Never   Smokeless tobacco: Never  Substance and Sexual Activity   Alcohol use: No   Drug use: No   Sexual activity: Yes  Other Topics Concern   Not on file  Social History Narrative   Not on file   Social Determinants of Health   Financial Resource Strain: Not on file  Food Insecurity: Not on file  Transportation Needs: Not on file  Physical Activity: Not on file  Stress: Not on file  Social Connections: Not on file  Intimate Partner Violence: Not on file    Outpatient Medications Prior to Visit  Medication Sig Dispense  Refill   amLODipine (NORVASC) 2.5 MG tablet Take 1 tablet (2.5 mg total) by mouth daily. 90 tablet 3   Probiotic Product (ALIGN) 4 MG CAPS Take 1 capsule (4 mg total) by mouth daily. 56 capsule 11   levothyroxine (SYNTHROID) 100 MCG tablet Take 1 tablet (100 mcg total) by mouth daily before breakfast. 90 tablet 0   lisinopril (ZESTRIL) 10 MG tablet Take 1 tablet (10 mg total) by mouth daily. 90 tablet 3   amoxicillin-clavulanate (AUGMENTIN) 875-125 MG tablet Take 1 tablet by mouth every 12 (twelve) hours. (Patient not taking: Reported on 05/22/2021) 14 tablet 0   CAMILA 0.35 MG tablet Take 1 tablet by mouth daily.  (Patient not taking: Reported on 05/22/2021)     meclizine (ANTIVERT) 25 MG tablet Take 1 tablet (25 mg total) by mouth 3 (three) times daily as needed for  dizziness. (Patient not taking: Reported on 05/22/2021) 30 tablet 0   ondansetron (ZOFRAN) 4 MG tablet Take 1 tablet (4 mg total) by mouth every 8 (eight) hours as needed for nausea or vomiting. (Patient not taking: Reported on 05/22/2021) 20 tablet 0   Facility-Administered Medications Prior to Visit  Medication Dose Route Frequency Provider Last Rate Last Admin   hyoscyamine (LEVSIN SL) SL tablet 0.125 mg  0.125 mg Oral Q4H PRN Roma Schanz R, DO        No Known Allergies  Review of Systems  Constitutional:  Negative for fever.  HENT:  Negative for congestion, ear pain, sinus pain and sore throat.   Eyes:  Negative for pain.  Respiratory:  Negative for cough, shortness of breath and wheezing.   Cardiovascular:  Negative for chest pain and palpitations.  Gastrointestinal:  Negative for blood in stool, constipation, diarrhea, nausea and vomiting.  Genitourinary:  Negative for dysuria, frequency and hematuria.  Neurological:  Negative for headaches.  Psychiatric/Behavioral:  Negative for depression. The patient is not nervous/anxious.       Objective:    Physical Exam Constitutional:      General: She is not in acute distress.    Appearance: Normal appearance. She is not ill-appearing.  HENT:     Head: Normocephalic and atraumatic.     Right Ear: Tympanic membrane, ear canal and external ear normal.     Left Ear: Tympanic membrane, ear canal and external ear normal.  Eyes:     Extraocular Movements: Extraocular movements intact.     Pupils: Pupils are equal, round, and reactive to light.  Cardiovascular:     Rate and Rhythm: Normal rate and regular rhythm.     Pulses: Normal pulses.     Heart sounds: Normal heart sounds. No murmur heard.   No gallop.  Pulmonary:     Effort: Pulmonary effort is normal. No respiratory distress.     Breath sounds: Normal breath sounds. No wheezing, rhonchi or rales.  Abdominal:     General: Bowel sounds are normal. There is no distension.      Palpations: Abdomen is soft. There is no mass.     Tenderness: There is no abdominal tenderness. There is no guarding or rebound.     Hernia: No hernia is present.  Skin:    General: Skin is warm and dry.  Neurological:     Mental Status: She is alert and oriented to person, place, and time.  Psychiatric:        Behavior: Behavior normal.    BP 110/72 (BP Location: Right Arm, Patient Position: Sitting, Cuff Size:  Normal)   Pulse 63   Temp 99.3 F (37.4 C) (Oral)   Resp 18   Ht 5\' 2"  (1.575 m)   Wt 128 lb 6.4 oz (58.2 kg)   LMP  (LMP Unknown)   SpO2 100%   BMI 23.48 kg/m  Wt Readings from Last 3 Encounters:  05/22/21 128 lb 6.4 oz (58.2 kg)  05/14/20 130 lb 3.2 oz (59.1 kg)  11/03/19 131 lb 12.8 oz (59.8 kg)    Diabetic Foot Exam - Simple   No data filed    Lab Results  Component Value Date   WBC 7.6 11/03/2019   HGB 14.3 11/03/2019   HCT 43.1 11/03/2019   PLT 275.0 11/03/2019   GLUCOSE 95 05/14/2020   CHOL 154 05/14/2020   TRIG 43.0 05/14/2020   HDL 63.00 05/14/2020   LDLCALC 83 05/14/2020   ALT 17 05/14/2020   AST 17 05/14/2020   NA 136 05/14/2020   K 4.1 05/14/2020   CL 101 05/14/2020   CREATININE 0.84 05/14/2020   BUN 9 05/14/2020   CO2 27 05/14/2020   TSH 1.61 05/14/2020   HGBA1C 5.5 09/15/2016    Lab Results  Component Value Date   TSH 1.61 05/14/2020   Lab Results  Component Value Date   WBC 7.6 11/03/2019   HGB 14.3 11/03/2019   HCT 43.1 11/03/2019   MCV 91.2 11/03/2019   PLT 275.0 11/03/2019   Lab Results  Component Value Date   NA 136 05/14/2020   K 4.1 05/14/2020   CO2 27 05/14/2020   GLUCOSE 95 05/14/2020   BUN 9 05/14/2020   CREATININE 0.84 05/14/2020   BILITOT 0.5 05/14/2020   ALKPHOS 71 05/14/2020   AST 17 05/14/2020   ALT 17 05/14/2020   PROT 7.3 05/14/2020   ALBUMIN 4.5 05/14/2020   CALCIUM 9.3 05/14/2020   GFR 72.18 05/14/2020   Lab Results  Component Value Date   CHOL 154 05/14/2020   Lab Results   Component Value Date   HDL 63.00 05/14/2020   Lab Results  Component Value Date   LDLCALC 83 05/14/2020   Lab Results  Component Value Date   TRIG 43.0 05/14/2020   Lab Results  Component Value Date   CHOLHDL 2 05/14/2020   Lab Results  Component Value Date   HGBA1C 5.5 09/15/2016   Mammogram- Last completed 04/27/2018. Results normal. Repeat in 1 year. Pap Smear- Last completed 04/27/2018. Results normal. Repeat in 3 years. Colonoscopy- Completed 05/01/2021. Repeat in 5 years. Health Maintenance  Topic Date Due   Hepatitis C Screening  Never done   MAMMOGRAM  04/28/2019   COVID-19 Vaccine (3 - Booster for Pfizer series) 04/28/2020   PAP SMEAR-Modifier  04/27/2021   TETANUS/TDAP  05/22/2022 (Originally 11/22/2016)   HIV Screening  08/30/2024 (Originally 10/10/1986)   INFLUENZA VACCINE  06/16/2021   COLONOSCOPY (Pts 45-73yrs Insurance coverage will need to be confirmed)  05/01/2026   Pneumococcal Vaccine 90-47 Years old  Aged Out   HPV VACCINES  Aged Out       Assessment & Plan:   Problem List Items Addressed This Visit       Unprioritized   Benign paroxysmal vertigo    Per ent  Pt is doing epley manuvers at home        Essential hypertension    Well controlled, no changes to meds. Encouraged heart healthy diet such as the DASH diet and exercise as tolerated.        Relevant Medications  lisinopril (ZESTRIL) 10 MG tablet   Other Relevant Orders   CBC with Differential/Platelet   Lipid panel   Comprehensive metabolic panel   Hypothyroidism    Check labs today Refill meds        Relevant Medications   levothyroxine (SYNTHROID) 100 MCG tablet   Other Relevant Orders   TSH   Lymphocytic colitis    Per GI       Preventative health care - Primary    ghm utd Check labsl  Pt refusing tdap -- she said last one she had she had trouble with         Relevant Orders   TSH   CBC with Differential/Platelet   Lipid panel   Comprehensive metabolic  panel   Other Visit Diagnoses     Need for hepatitis C screening test       Relevant Orders   Hepatitis C antibody        Meds ordered this encounter  Medications   lisinopril (ZESTRIL) 10 MG tablet    Sig: Take 1 tablet (10 mg total) by mouth daily.    Dispense:  90 tablet    Refill:  3   levothyroxine (SYNTHROID) 100 MCG tablet    Sig: Take 1 tablet (100 mcg total) by mouth daily before breakfast.    Dispense:  90 tablet    Refill:  3    I, Dr. Roma Schanz, DO, personally preformed the services described in this documentation.  All medical record entries made by the scribe were at my direction and in my presence.  I have reviewed the chart and discharge instructions (if applicable) and agree that the record reflects my personal performance and is accurate and complete. 05/22/2021   I,Shehryar Baig,acting as a scribe for Ann Held, DO.,have documented all relevant documentation on the behalf of Ann Held, DO,as directed by  Ann Held, DO while in the presence of Ann Held, DO.   Ann Held, DO

## 2021-05-23 DIAGNOSIS — K52832 Lymphocytic colitis: Secondary | ICD-10-CM | POA: Diagnosis not present

## 2021-05-23 LAB — HEPATITIS C ANTIBODY
Hepatitis C Ab: NONREACTIVE
SIGNAL TO CUT-OFF: 0.02 (ref <1.00)

## 2021-07-14 ENCOUNTER — Other Ambulatory Visit: Payer: Self-pay | Admitting: Family Medicine

## 2021-07-14 DIAGNOSIS — I10 Essential (primary) hypertension: Secondary | ICD-10-CM

## 2021-07-14 DIAGNOSIS — I7300 Raynaud's syndrome without gangrene: Secondary | ICD-10-CM

## 2021-08-28 DIAGNOSIS — K52832 Lymphocytic colitis: Secondary | ICD-10-CM | POA: Diagnosis not present

## 2021-08-28 DIAGNOSIS — R198 Other specified symptoms and signs involving the digestive system and abdomen: Secondary | ICD-10-CM | POA: Diagnosis not present

## 2021-11-25 DIAGNOSIS — Z1231 Encounter for screening mammogram for malignant neoplasm of breast: Secondary | ICD-10-CM | POA: Diagnosis not present

## 2021-11-25 DIAGNOSIS — Z01419 Encounter for gynecological examination (general) (routine) without abnormal findings: Secondary | ICD-10-CM | POA: Diagnosis not present

## 2021-11-25 DIAGNOSIS — Z800 Family history of malignant neoplasm of digestive organs: Secondary | ICD-10-CM | POA: Diagnosis not present

## 2021-11-25 DIAGNOSIS — N950 Postmenopausal bleeding: Secondary | ICD-10-CM | POA: Diagnosis not present

## 2021-11-25 DIAGNOSIS — Z6822 Body mass index (BMI) 22.0-22.9, adult: Secondary | ICD-10-CM | POA: Diagnosis not present

## 2021-11-25 DIAGNOSIS — K52832 Lymphocytic colitis: Secondary | ICD-10-CM | POA: Diagnosis not present

## 2021-11-27 ENCOUNTER — Ambulatory Visit: Payer: BLUE CROSS/BLUE SHIELD | Admitting: Family Medicine

## 2021-12-16 ENCOUNTER — Encounter: Payer: Self-pay | Admitting: Family Medicine

## 2021-12-16 ENCOUNTER — Ambulatory Visit: Payer: BLUE CROSS/BLUE SHIELD | Admitting: Family Medicine

## 2021-12-16 VITALS — BP 118/80 | HR 55 | Temp 98.5°F | Resp 16 | Ht 62.0 in | Wt 126.8 lb

## 2021-12-16 DIAGNOSIS — I10 Essential (primary) hypertension: Secondary | ICD-10-CM

## 2021-12-16 MED ORDER — LOSARTAN POTASSIUM 50 MG PO TABS: 50.0000 mg | ORAL_TABLET | Freq: Every day | ORAL | 2 refills | Status: DC

## 2021-12-16 NOTE — Assessment & Plan Note (Signed)
Well controlled, no changes to meds. Encouraged heart healthy diet such as the DASH diet and exercise as tolerated.  Pt was concerned that the lisinopril caused her colitis ---- we will change it to losartan and recheck in 1 month

## 2021-12-16 NOTE — Progress Notes (Signed)
New Patient Office Visit  Subjective:  Patient ID: Megan Escobar, female    DOB: [birth year]  Age: 51 y.o. MRN: [id number]  CC:  Chief Complaint  Patient presents with   Hypertension   Follow-up    HPI LUV MISH presents for f/u bp.  No complaints   Past Medical History:  Diagnosis Date   HTN (hypertension)    Hyperlipidemia    Raynaud's disease    Thyroid disease    Hypothyroid    Past Surgical History:  Procedure Laterality Date   CESAREAN SECTION     x's 2   MYOMECTOMY      Family History  Problem Relation Age of Onset   Irritable bowel syndrome Mother    Heart disease Mother 57       cabg   Hypertension Mother    Hyperlipidemia Mother    Coronary artery disease Mother    Heart disease Father        stent   Hypertension Father    Hyperlipidemia Father    Coronary artery disease Father    Breast cancer Paternal Aunt    Breast cancer Paternal Grandmother     Social History   Socioeconomic History   Marital status: Married    Spouse name: Not on file   Number of children: Not on file   Years of education: Not on file   Highest education level: Not on file  Occupational History   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[employer]  Tobacco Use   Smoking status: Never   Smokeless tobacco: Never  Substance and Sexual Activity   Alcohol use: No   Drug use: No   Sexual activity: Yes  Other Topics Concern   Not on file  Social History Narrative   Not on file   Social Determinants of Health   Financial Resource Strain: Not on file  Food Insecurity: Not on file  Transportation Needs: Not on file  Physical Activity: Not on file  Stress: Not on file  Social Connections: Not on file  Intimate Partner Violence: Not on file    ROS Review of Systems  Constitutional:  Negative for activity change, appetite change and unexpected weight change.  Respiratory:  Negative for cough and shortness of breath.   Cardiovascular:  Negative  for chest pain and palpitations.  Psychiatric/Behavioral:  Negative for behavioral problems and dysphoric mood. The patient is not nervous/anxious.    Objective:   Today's Vitals: BP 118/80 (BP Location: Right Arm, Patient Position: Sitting, Cuff Size: Normal)    Pulse (!) 55    Temp 98.5 F (36.9 C) (Oral)    Resp 16    Ht 5\' 2"  (1.575 m)    Wt 126 lb 12.8 oz (57.5 kg)    LMP  (LMP Unknown)    SpO2 99%    BMI 23.19 kg/m   Physical Exam Vitals and nursing note reviewed.  Constitutional:      Appearance: She is well-developed.  HENT:     Head: Normocephalic and atraumatic.  Eyes:     Conjunctiva/sclera: Conjunctivae normal.  Neck:     Thyroid: No thyromegaly.     Vascular: No carotid bruit or JVD.  Cardiovascular:     Rate and Rhythm: Normal rate and regular rhythm.     Heart sounds: Normal heart sounds. No murmur heard. Pulmonary:     Effort: Pulmonary effort is normal. No respiratory distress.     Breath sounds: Normal breath  sounds. No wheezing or rales.  Chest:     Chest wall: No tenderness.  Musculoskeletal:     Cervical back: Normal range of motion and neck supple.  Neurological:     Mental Status: She is alert and oriented to person, place, and time.    Assessment & Plan:   Problem List Items Addressed This Visit       Unprioritized   BP (high blood pressure) - Primary   Relevant Medications   losartan (COZAAR) 50 MG tablet   Other Relevant Orders   Lipid panel   Comprehensive metabolic panel   Essential hypertension    Well controlled, no changes to meds. Encouraged heart healthy diet such as the DASH diet and exercise as tolerated.  Pt was concerned that the lisinopril caused her colitis ---- we will change it to losartan and recheck in 1 month      Relevant Medications   losartan (COZAAR) 50 MG tablet    Outpatient Encounter Medications as of 12/16/2021  Medication Sig   amLODipine (NORVASC) 2.5 MG tablet Take 1 tablet by mouth once daily (Patient  taking differently: as needed.)   budesonide (ENTOCORT EC) 3 MG 24 hr capsule 3 capsules   dicyclomine (BENTYL) 10 MG capsule Take by mouth.   levothyroxine (SYNTHROID) 100 MCG tablet Take 1 tablet (100 mcg total) by mouth daily before breakfast.   losartan (COZAAR) 50 MG tablet Take 1 tablet (50 mg total) by mouth daily.   [DISCONTINUED] lisinopril (ZESTRIL) 10 MG tablet Take 1 tablet (10 mg total) by mouth daily.   [DISCONTINUED] Probiotic Product (ALIGN) 4 MG CAPS Take 1 capsule (4 mg total) by mouth daily. (Patient not taking: Reported on 12/16/2021)   Facility-Administered Encounter Medications as of 12/16/2021  Medication   hyoscyamine (LEVSIN SL) SL tablet 0.125 mg    Follow-up: Return in about 4 weeks (around 01/13/2022), or nurse visit for bp check----- 6 month f/u, for hypertension.   Ann Held, DO

## 2021-12-16 NOTE — Patient Instructions (Signed)

## 2022-01-14 ENCOUNTER — Other Ambulatory Visit: Payer: BLUE CROSS/BLUE SHIELD

## 2022-01-14 ENCOUNTER — Ambulatory Visit: Payer: BLUE CROSS/BLUE SHIELD

## 2022-01-15 ENCOUNTER — Other Ambulatory Visit (INDEPENDENT_AMBULATORY_CARE_PROVIDER_SITE_OTHER): Payer: BLUE CROSS/BLUE SHIELD

## 2022-01-15 ENCOUNTER — Ambulatory Visit: Payer: BLUE CROSS/BLUE SHIELD

## 2022-01-15 VITALS — BP 127/84

## 2022-01-15 DIAGNOSIS — I10 Essential (primary) hypertension: Secondary | ICD-10-CM | POA: Diagnosis not present

## 2022-01-15 LAB — LIPID PANEL
Cholesterol: 209 mg/dL — ABNORMAL HIGH (ref 0–200)
HDL: 90.3 mg/dL (ref >39.00)
LDL Cholesterol: 110 mg/dL — ABNORMAL HIGH (ref 0–99)
NonHDL: 119.11
Total CHOL/HDL Ratio: 2
Triglycerides: 46 mg/dL (ref 0.0–149.0)
VLDL: 9.2 mg/dL (ref 0.0–40.0)

## 2022-01-15 LAB — COMPREHENSIVE METABOLIC PANEL
ALT: 13 U/L (ref 0–35)
AST: 13 U/L (ref 0–37)
Albumin: 4.5 g/dL (ref 3.5–5.2)
Alkaline Phosphatase: 53 U/L (ref 39–117)
BUN: 10 mg/dL (ref 6–23)
CO2: 30 mEq/L (ref 19–32)
Calcium: 9.5 mg/dL (ref 8.4–10.5)
Chloride: 101 mEq/L (ref 96–112)
Creatinine, Ser: 0.88 mg/dL (ref 0.40–1.20)
GFR: 76.71 mL/min (ref >60.00)
Glucose, Bld: 100 mg/dL — ABNORMAL HIGH (ref 70–99)
Potassium: 3.9 mEq/L (ref 3.5–5.1)
Sodium: 138 mEq/L (ref 135–145)
Total Bilirubin: 0.6 mg/dL (ref 0.2–1.2)
Total Protein: 7.3 g/dL (ref 6.0–8.3)

## 2022-01-15 NOTE — Progress Notes (Signed)
BP Readings from Last 3 Encounters:  ?12/16/21 118/80  ?05/22/21 110/72  ?05/05/21 125/71  ? Patient here for one month BP check due to change in medication as per visit 1-31 ?Pt was concerned that the lisinopril caused her colitis ---- we will change it to losartan and recheck in 1 month ? ?BP today 127/84. Patient advised to follow up in July for physical as scheduled.  ?

## 2022-03-17 ENCOUNTER — Other Ambulatory Visit: Payer: Self-pay | Admitting: Family Medicine

## 2022-03-17 DIAGNOSIS — I10 Essential (primary) hypertension: Secondary | ICD-10-CM

## 2022-04-21 DIAGNOSIS — K52832 Lymphocytic colitis: Secondary | ICD-10-CM | POA: Diagnosis not present

## 2022-04-21 DIAGNOSIS — Z800 Family history of malignant neoplasm of digestive organs: Secondary | ICD-10-CM | POA: Diagnosis not present

## 2022-05-28 ENCOUNTER — Encounter: Payer: BLUE CROSS/BLUE SHIELD | Admitting: Family Medicine

## 2022-06-07 ENCOUNTER — Other Ambulatory Visit: Payer: Self-pay | Admitting: Family Medicine

## 2022-06-07 DIAGNOSIS — I10 Essential (primary) hypertension: Secondary | ICD-10-CM

## 2022-06-07 DIAGNOSIS — E039 Hypothyroidism, unspecified: Secondary | ICD-10-CM

## 2022-06-18 ENCOUNTER — Ambulatory Visit: Payer: BLUE CROSS/BLUE SHIELD | Admitting: Family Medicine

## 2022-07-10 ENCOUNTER — Encounter: Payer: BLUE CROSS/BLUE SHIELD | Admitting: Family Medicine

## 2022-08-27 ENCOUNTER — Other Ambulatory Visit: Payer: Self-pay | Admitting: Family Medicine

## 2022-08-27 DIAGNOSIS — E039 Hypothyroidism, unspecified: Secondary | ICD-10-CM

## 2022-09-08 ENCOUNTER — Other Ambulatory Visit: Payer: Self-pay | Admitting: Family Medicine

## 2022-09-08 DIAGNOSIS — I10 Essential (primary) hypertension: Secondary | ICD-10-CM

## 2022-09-22 DIAGNOSIS — K52832 Lymphocytic colitis: Secondary | ICD-10-CM | POA: Diagnosis not present

## 2022-09-22 DIAGNOSIS — Z800 Family history of malignant neoplasm of digestive organs: Secondary | ICD-10-CM | POA: Diagnosis not present

## 2022-10-01 ENCOUNTER — Encounter: Payer: BLUE CROSS/BLUE SHIELD | Admitting: Family Medicine

## 2022-10-12 DIAGNOSIS — S42291A Other displaced fracture of upper end of right humerus, initial encounter for closed fracture: Secondary | ICD-10-CM | POA: Diagnosis not present

## 2022-10-12 DIAGNOSIS — Z79899 Other long term (current) drug therapy: Secondary | ICD-10-CM | POA: Diagnosis not present

## 2022-10-12 DIAGNOSIS — S42201A Unspecified fracture of upper end of right humerus, initial encounter for closed fracture: Secondary | ICD-10-CM | POA: Diagnosis not present

## 2022-10-12 DIAGNOSIS — M79621 Pain in right upper arm: Secondary | ICD-10-CM | POA: Diagnosis not present

## 2022-10-12 DIAGNOSIS — I10 Essential (primary) hypertension: Secondary | ICD-10-CM | POA: Diagnosis not present

## 2022-10-12 DIAGNOSIS — W010XXA Fall on same level from slipping, tripping and stumbling without subsequent striking against object, initial encounter: Secondary | ICD-10-CM | POA: Diagnosis not present

## 2022-10-14 DIAGNOSIS — M25511 Pain in right shoulder: Secondary | ICD-10-CM | POA: Diagnosis not present

## 2022-10-14 DIAGNOSIS — S42201A Unspecified fracture of upper end of right humerus, initial encounter for closed fracture: Secondary | ICD-10-CM | POA: Diagnosis not present

## 2022-10-19 ENCOUNTER — Encounter: Payer: BLUE CROSS/BLUE SHIELD | Admitting: Family Medicine

## 2022-10-20 DIAGNOSIS — G8918 Other acute postprocedural pain: Secondary | ICD-10-CM | POA: Diagnosis not present

## 2022-10-20 DIAGNOSIS — Z79899 Other long term (current) drug therapy: Secondary | ICD-10-CM | POA: Diagnosis not present

## 2022-10-20 DIAGNOSIS — S42291A Other displaced fracture of upper end of right humerus, initial encounter for closed fracture: Secondary | ICD-10-CM | POA: Diagnosis not present

## 2022-10-20 DIAGNOSIS — E039 Hypothyroidism, unspecified: Secondary | ICD-10-CM | POA: Diagnosis not present

## 2022-10-20 DIAGNOSIS — S42201A Unspecified fracture of upper end of right humerus, initial encounter for closed fracture: Secondary | ICD-10-CM | POA: Diagnosis not present

## 2022-10-20 DIAGNOSIS — Z79890 Hormone replacement therapy (postmenopausal): Secondary | ICD-10-CM | POA: Diagnosis not present

## 2022-10-20 DIAGNOSIS — X58XXXA Exposure to other specified factors, initial encounter: Secondary | ICD-10-CM | POA: Diagnosis not present

## 2022-10-20 DIAGNOSIS — I10 Essential (primary) hypertension: Secondary | ICD-10-CM | POA: Diagnosis not present

## 2022-10-20 HISTORY — PX: ORIF HUMERUS FRACTURE: SHX2126

## 2022-10-21 DIAGNOSIS — Z79899 Other long term (current) drug therapy: Secondary | ICD-10-CM | POA: Diagnosis not present

## 2022-10-21 DIAGNOSIS — E039 Hypothyroidism, unspecified: Secondary | ICD-10-CM | POA: Diagnosis not present

## 2022-10-21 DIAGNOSIS — I10 Essential (primary) hypertension: Secondary | ICD-10-CM | POA: Diagnosis not present

## 2022-10-21 DIAGNOSIS — X58XXXA Exposure to other specified factors, initial encounter: Secondary | ICD-10-CM | POA: Diagnosis not present

## 2022-10-21 DIAGNOSIS — S42291A Other displaced fracture of upper end of right humerus, initial encounter for closed fracture: Secondary | ICD-10-CM | POA: Diagnosis not present

## 2022-10-21 DIAGNOSIS — Z79890 Hormone replacement therapy (postmenopausal): Secondary | ICD-10-CM | POA: Diagnosis not present

## 2022-10-21 DIAGNOSIS — S42201A Unspecified fracture of upper end of right humerus, initial encounter for closed fracture: Secondary | ICD-10-CM | POA: Diagnosis not present

## 2022-11-02 DIAGNOSIS — S42201A Unspecified fracture of upper end of right humerus, initial encounter for closed fracture: Secondary | ICD-10-CM | POA: Diagnosis not present

## 2022-11-03 ENCOUNTER — Encounter: Payer: Self-pay | Admitting: Family Medicine

## 2022-11-03 ENCOUNTER — Ambulatory Visit (INDEPENDENT_AMBULATORY_CARE_PROVIDER_SITE_OTHER): Payer: BLUE CROSS/BLUE SHIELD | Admitting: Family Medicine

## 2022-11-03 VITALS — BP 120/70 | HR 53 | Temp 98.3°F | Resp 18 | Ht 62.0 in | Wt 138.0 lb

## 2022-11-03 DIAGNOSIS — K52832 Lymphocytic colitis: Secondary | ICD-10-CM

## 2022-11-03 DIAGNOSIS — S42201A Unspecified fracture of upper end of right humerus, initial encounter for closed fracture: Secondary | ICD-10-CM

## 2022-11-03 DIAGNOSIS — I7300 Raynaud's syndrome without gangrene: Secondary | ICD-10-CM | POA: Diagnosis not present

## 2022-11-03 DIAGNOSIS — I10 Essential (primary) hypertension: Secondary | ICD-10-CM | POA: Diagnosis not present

## 2022-11-03 DIAGNOSIS — E039 Hypothyroidism, unspecified: Secondary | ICD-10-CM | POA: Diagnosis not present

## 2022-11-03 MED ORDER — LEVOTHYROXINE SODIUM 100 MCG PO TABS: 100.0000 ug | ORAL_TABLET | Freq: Every day | ORAL | 1 refills | Status: DC

## 2022-11-03 MED ORDER — LOSARTAN POTASSIUM 50 MG PO TABS: 50.0000 mg | ORAL_TABLET | Freq: Every day | ORAL | 1 refills | Status: DC

## 2022-11-03 MED ORDER — AMLODIPINE BESYLATE 2.5 MG PO TABS: 2.5000 mg | ORAL_TABLET | Freq: Every day | ORAL | 1 refills | Status: DC

## 2022-11-03 NOTE — Progress Notes (Signed)
Subjective:   By signing my name below, I, Megan Escobar, attest that this documentation has been prepared under the direction and in the presence of Roma Schanz, 11/03/2022.   Patient ID: Megan Escobar, female    DOB: [birth year], 50 y.o.   MRN: [id number]  Chief Complaint  Patient presents with   Post surgery follow up    HPI Patient is in today for an office visit.   Post Surgery Follow Up Patient had a right proximal humerus open reduction internal fixation completed on 12/05. She reports that she injured her shoulder when she fell running at night on 11/27. She states that she is slowly improving with each day. Patient expresses that she is not taking much pain medications, but states that she struggles sleeping at night trying to find a comfortable position.   Immunizations She is uninterested in receiving a Tetanus vaccine today.   Health Maintenance Due  Topic Date Due   DTaP/Tdap/Td (2 - Td or Tdap) 11/22/2016   MAMMOGRAM  04/28/2019   PAP SMEAR-Modifier  04/27/2021   Zoster Vaccines- Shingrix (1 of 2) Never done   COVID-19 Vaccine (3 - 2023-24 season) 07/17/2022    Past Medical History:  Diagnosis Date   HTN (hypertension)    Hyperlipidemia    Raynaud's disease    Thyroid disease    Hypothyroid    Past Surgical History:  Procedure Laterality Date   CESAREAN SECTION     x's 2   MYOMECTOMY      Family History  Problem Relation Age of Onset   Irritable bowel syndrome Mother    Heart disease Mother 31       cabg   Hypertension Mother    Hyperlipidemia Mother    Coronary artery disease Mother    Heart disease Father        stent   Hypertension Father    Hyperlipidemia Father    Coronary artery disease Father    Breast cancer Paternal Aunt    Breast cancer Paternal Grandmother     Social History   Socioeconomic History   Marital status: Married    Spouse name: Not on file   Number of children: Not on file   Years of education: Not on  file   Highest education level: Not on file  Occupational History   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[employer]  Tobacco Use   Smoking status: Never   Smokeless tobacco: Never  Substance and Sexual Activity   Alcohol use: No   Drug use: No   Sexual activity: Yes  Other Topics Concern   Not on file  Social History Narrative   Not on file   Social Determinants of Health   Financial Resource Strain: Not on file  Food Insecurity: Not on file  Transportation Needs: Not on file  Physical Activity: Not on file  Stress: Not on file  Social Connections: Not on file  Intimate Partner Violence: Not on file    Outpatient Medications Prior to Visit  Medication Sig Dispense Refill   budesonide (ENTOCORT EC) 3 MG 24 hr capsule 3 capsules     dicyclomine (BENTYL) 10 MG capsule Take by mouth.     amLODipine (NORVASC) 2.5 MG tablet Take 1 tablet by mouth once daily (Patient taking differently: as needed.) 90 tablet 1   levothyroxine (SYNTHROID) 100 MCG tablet Take 1 tablet (100 mcg total) by mouth daily before breakfast. 90 tablet 0   losartan (COZAAR)  50 MG tablet Take 1 tablet by mouth once daily 90 tablet 0   Facility-Administered Medications Prior to Visit  Medication Dose Route Frequency Provider Last Rate Last Admin   hyoscyamine (LEVSIN SL) SL tablet 0.125 mg  0.125 mg Oral Q4H PRN Roma Schanz R, DO        No Known Allergies  Review of Systems  Constitutional:  Negative for chills, fever and malaise/fatigue.  HENT:  Negative for congestion and hearing loss.   Eyes:  Negative for discharge.  Respiratory:  Negative for cough, sputum production and shortness of breath.   Cardiovascular:  Negative for chest pain, palpitations and leg swelling.  Gastrointestinal:  Negative for abdominal pain, blood in stool, constipation, diarrhea, heartburn, nausea and vomiting.  Genitourinary:  Negative for dysuria, frequency, hematuria and urgency.  Musculoskeletal:   Negative for back pain, falls and myalgias.  Skin:  Negative for rash.  Neurological:  Negative for dizziness, sensory change, loss of consciousness, weakness and headaches.  Endo/Heme/Allergies:  Negative for environmental allergies. Does not bruise/bleed easily.  Psychiatric/Behavioral:  Negative for depression and suicidal ideas. The patient is not nervous/anxious and does not have insomnia.        Objective:    Physical Exam Vitals and nursing note reviewed.  Constitutional:      General: She is not in acute distress.    Appearance: Normal appearance. She is not ill-appearing.  HENT:     Head: Normocephalic and atraumatic.     Right Ear: External ear normal.     Left Ear: External ear normal.  Eyes:     Extraocular Movements: Extraocular movements intact.     Pupils: Pupils are equal, round, and reactive to light.  Cardiovascular:     Rate and Rhythm: Normal rate and regular rhythm.     Heart sounds: Normal heart sounds. No murmur heard.    No gallop.  Pulmonary:     Effort: Pulmonary effort is normal. No respiratory distress.     Breath sounds: Normal breath sounds. No wheezing or rales.  Skin:    General: Skin is warm and dry.  Neurological:     General: No focal deficit present.     Mental Status: She is alert and oriented to person, place, and time.  Psychiatric:        Mood and Affect: Mood normal.        Judgment: Judgment normal.   MS-- R arm in sling   BP 120/70 (BP Location: Left Arm, Patient Position: Sitting, Cuff Size: Normal)   Pulse (!) 53   Temp 98.3 F (36.8 C) (Oral)   Resp 18   Ht '5\' 2"'$  (1.575 m)   Wt 138 lb (62.6 kg)   LMP  (LMP Unknown)   SpO2 100%   BMI 25.24 kg/m  Wt Readings from Last 3 Encounters:  11/03/22 138 lb (62.6 kg)  12/16/21 126 lb 12.8 oz (57.5 kg)  05/22/21 128 lb 6.4 oz (58.2 kg)       Assessment & Plan:   Problem List Items Addressed This Visit       Unprioritized   Raynaud's disease   Relevant Medications    amLODipine (NORVASC) 2.5 MG tablet   losartan (COZAAR) 50 MG tablet   Lymphocytic colitis    Per GI      Relevant Orders   CBC with Differential/Platelet   Comprehensive metabolic panel   Lipid panel   TSH   Hypothyroidism    Check labs  Con't  synthroid      Relevant Medications   levothyroxine (SYNTHROID) 100 MCG tablet   Other Relevant Orders   CBC with Differential/Platelet   Comprehensive metabolic panel   Lipid panel   TSH   Essential hypertension - Primary    Well controlled, no changes to meds. Encouraged heart healthy diet such as the DASH diet and exercise as tolerated.        Relevant Medications   amLODipine (NORVASC) 2.5 MG tablet   losartan (COZAAR) 50 MG tablet   Other Relevant Orders   CBC with Differential/Platelet   Comprehensive metabolic panel   Lipid panel   TSH   Closed fracture of right proximal humerus    Per ortho      BP (high blood pressure)   Relevant Medications   amLODipine (NORVASC) 2.5 MG tablet   losartan (COZAAR) 50 MG tablet   Meds ordered this encounter  Medications   amLODipine (NORVASC) 2.5 MG tablet    Sig: Take 1 tablet (2.5 mg total) by mouth daily.    Dispense:  90 tablet    Refill:  1   levothyroxine (SYNTHROID) 100 MCG tablet    Sig: Take 1 tablet (100 mcg total) by mouth daily before breakfast.    Dispense:  90 tablet    Refill:  1   losartan (COZAAR) 50 MG tablet    Sig: Take 1 tablet (50 mg total) by mouth daily.    Dispense:  90 tablet    Refill:  1    I, Roma Schanz, personally preformed the services described in this documentation.  All medical record entries made by the scribe were at my direction and in my presence.  I have reviewed the chart and discharge instructions (if applicable) and agree that the record reflects my personal performance and is accurate and complete. 11/03/2022.   I,Megan Escobar,acting as a Education administrator for [employer], DO.,have documented all relevant documentation on the  behalf of Ann Held, DO,as directed by  Ann Held, DO while in the presence of Ann Held, DO.    Ann Held, DO

## 2022-11-03 NOTE — Assessment & Plan Note (Signed)
Per ortho.  

## 2022-11-03 NOTE — Assessment & Plan Note (Signed)
Check labs  Con't synthroid 

## 2022-11-03 NOTE — Assessment & Plan Note (Signed)
Well controlled, no changes to meds. Encouraged heart healthy diet such as the DASH diet and exercise as tolerated.  °

## 2022-11-03 NOTE — Assessment & Plan Note (Signed)
Per GI

## 2022-11-04 LAB — LIPID PANEL
Cholesterol: 207 mg/dL — ABNORMAL HIGH (ref 0–200)
HDL: 82.5 mg/dL (ref >39.00)
LDL Cholesterol: 103 mg/dL — ABNORMAL HIGH (ref 0–99)
NonHDL: 124.67
Total CHOL/HDL Ratio: 3
Triglycerides: 106 mg/dL (ref 0.0–149.0)
VLDL: 21.2 mg/dL (ref 0.0–40.0)

## 2022-11-04 LAB — COMPREHENSIVE METABOLIC PANEL
ALT: 26 U/L (ref 0–35)
AST: 21 U/L (ref 0–37)
Albumin: 4.5 g/dL (ref 3.5–5.2)
Alkaline Phosphatase: 73 U/L (ref 39–117)
BUN: 14 mg/dL (ref 6–23)
CO2: 30 mEq/L (ref 19–32)
Calcium: 9.3 mg/dL (ref 8.4–10.5)
Chloride: 98 mEq/L (ref 96–112)
Creatinine, Ser: 0.84 mg/dL (ref 0.40–1.20)
GFR: 80.66 mL/min (ref >60.00)
Glucose, Bld: 98 mg/dL (ref 70–99)
Potassium: 3.7 mEq/L (ref 3.5–5.1)
Sodium: 137 mEq/L (ref 135–145)
Total Bilirubin: 0.5 mg/dL (ref 0.2–1.2)
Total Protein: 7.6 g/dL (ref 6.0–8.3)

## 2022-11-04 LAB — CBC WITH DIFFERENTIAL/PLATELET
Basophils Absolute: 0.1 10*3/uL (ref 0.0–0.1)
Basophils Relative: 1.1 % (ref 0.0–3.0)
Eosinophils Absolute: 0.1 10*3/uL (ref 0.0–0.7)
Eosinophils Relative: 1.5 % (ref 0.0–5.0)
HCT: 37.5 % (ref 36.0–46.0)
Hemoglobin: 12.7 g/dL (ref 12.0–15.0)
Lymphocytes Relative: 25.6 % (ref 12.0–46.0)
Lymphs Abs: 2 10*3/uL (ref 0.7–4.0)
MCHC: 33.7 g/dL (ref 30.0–36.0)
MCV: 91.1 fl (ref 78.0–100.0)
Monocytes Absolute: 0.7 10*3/uL (ref 0.1–1.0)
Monocytes Relative: 9.1 % (ref 3.0–12.0)
Neutro Abs: 4.8 10*3/uL (ref 1.4–7.7)
Neutrophils Relative %: 62.7 % (ref 43.0–77.0)
Platelets: 363 10*3/uL (ref 150.0–400.0)
RBC: 4.12 Mil/uL (ref 3.87–5.11)
RDW: 15.1 % (ref 11.5–15.5)
WBC: 7.7 10*3/uL (ref 4.0–10.5)

## 2022-11-04 LAB — TSH: TSH: 2.83 u[IU]/mL (ref 0.35–5.50)

## 2022-11-10 ENCOUNTER — Encounter: Payer: Self-pay | Admitting: Family Medicine

## 2022-11-25 DIAGNOSIS — S42201A Unspecified fracture of upper end of right humerus, initial encounter for closed fracture: Secondary | ICD-10-CM | POA: Diagnosis not present

## 2022-12-01 DIAGNOSIS — M6281 Muscle weakness (generalized): Secondary | ICD-10-CM | POA: Diagnosis not present

## 2022-12-01 DIAGNOSIS — S42201A Unspecified fracture of upper end of right humerus, initial encounter for closed fracture: Secondary | ICD-10-CM | POA: Diagnosis not present

## 2022-12-01 DIAGNOSIS — X58XXXA Exposure to other specified factors, initial encounter: Secondary | ICD-10-CM | POA: Diagnosis not present

## 2022-12-01 DIAGNOSIS — R293 Abnormal posture: Secondary | ICD-10-CM | POA: Diagnosis not present

## 2022-12-01 DIAGNOSIS — M25611 Stiffness of right shoulder, not elsewhere classified: Secondary | ICD-10-CM | POA: Diagnosis not present

## 2022-12-09 DIAGNOSIS — M25611 Stiffness of right shoulder, not elsewhere classified: Secondary | ICD-10-CM | POA: Diagnosis not present

## 2022-12-09 DIAGNOSIS — R293 Abnormal posture: Secondary | ICD-10-CM | POA: Diagnosis not present

## 2022-12-09 DIAGNOSIS — X58XXXA Exposure to other specified factors, initial encounter: Secondary | ICD-10-CM | POA: Diagnosis not present

## 2022-12-09 DIAGNOSIS — M6281 Muscle weakness (generalized): Secondary | ICD-10-CM | POA: Diagnosis not present

## 2022-12-09 DIAGNOSIS — S42201A Unspecified fracture of upper end of right humerus, initial encounter for closed fracture: Secondary | ICD-10-CM | POA: Diagnosis not present

## 2022-12-10 NOTE — Progress Notes (Signed)
Letter mailed out. Done 

## 2022-12-11 DIAGNOSIS — R293 Abnormal posture: Secondary | ICD-10-CM | POA: Diagnosis not present

## 2022-12-11 DIAGNOSIS — M6281 Muscle weakness (generalized): Secondary | ICD-10-CM | POA: Diagnosis not present

## 2022-12-11 DIAGNOSIS — M25611 Stiffness of right shoulder, not elsewhere classified: Secondary | ICD-10-CM | POA: Diagnosis not present

## 2022-12-11 DIAGNOSIS — S42201A Unspecified fracture of upper end of right humerus, initial encounter for closed fracture: Secondary | ICD-10-CM | POA: Diagnosis not present

## 2022-12-11 DIAGNOSIS — X58XXXA Exposure to other specified factors, initial encounter: Secondary | ICD-10-CM | POA: Diagnosis not present

## 2022-12-14 DIAGNOSIS — S42201A Unspecified fracture of upper end of right humerus, initial encounter for closed fracture: Secondary | ICD-10-CM | POA: Diagnosis not present

## 2022-12-14 DIAGNOSIS — M6281 Muscle weakness (generalized): Secondary | ICD-10-CM | POA: Diagnosis not present

## 2022-12-14 DIAGNOSIS — R293 Abnormal posture: Secondary | ICD-10-CM | POA: Diagnosis not present

## 2022-12-14 DIAGNOSIS — M25611 Stiffness of right shoulder, not elsewhere classified: Secondary | ICD-10-CM | POA: Diagnosis not present

## 2022-12-14 DIAGNOSIS — X58XXXA Exposure to other specified factors, initial encounter: Secondary | ICD-10-CM | POA: Diagnosis not present

## 2022-12-16 DIAGNOSIS — X58XXXA Exposure to other specified factors, initial encounter: Secondary | ICD-10-CM | POA: Diagnosis not present

## 2022-12-16 DIAGNOSIS — M6281 Muscle weakness (generalized): Secondary | ICD-10-CM | POA: Diagnosis not present

## 2022-12-16 DIAGNOSIS — M25611 Stiffness of right shoulder, not elsewhere classified: Secondary | ICD-10-CM | POA: Diagnosis not present

## 2022-12-16 DIAGNOSIS — R293 Abnormal posture: Secondary | ICD-10-CM | POA: Diagnosis not present

## 2022-12-16 DIAGNOSIS — S42201A Unspecified fracture of upper end of right humerus, initial encounter for closed fracture: Secondary | ICD-10-CM | POA: Diagnosis not present

## 2022-12-22 DIAGNOSIS — M25611 Stiffness of right shoulder, not elsewhere classified: Secondary | ICD-10-CM | POA: Diagnosis not present

## 2022-12-22 DIAGNOSIS — S42201A Unspecified fracture of upper end of right humerus, initial encounter for closed fracture: Secondary | ICD-10-CM | POA: Diagnosis not present

## 2022-12-22 DIAGNOSIS — R293 Abnormal posture: Secondary | ICD-10-CM | POA: Diagnosis not present

## 2022-12-22 DIAGNOSIS — X58XXXA Exposure to other specified factors, initial encounter: Secondary | ICD-10-CM | POA: Diagnosis not present

## 2022-12-24 DIAGNOSIS — R293 Abnormal posture: Secondary | ICD-10-CM | POA: Diagnosis not present

## 2022-12-24 DIAGNOSIS — X58XXXA Exposure to other specified factors, initial encounter: Secondary | ICD-10-CM | POA: Diagnosis not present

## 2022-12-24 DIAGNOSIS — S42201A Unspecified fracture of upper end of right humerus, initial encounter for closed fracture: Secondary | ICD-10-CM | POA: Diagnosis not present

## 2022-12-24 DIAGNOSIS — M25611 Stiffness of right shoulder, not elsewhere classified: Secondary | ICD-10-CM | POA: Diagnosis not present

## 2022-12-29 DIAGNOSIS — M25611 Stiffness of right shoulder, not elsewhere classified: Secondary | ICD-10-CM | POA: Diagnosis not present

## 2022-12-29 DIAGNOSIS — X58XXXA Exposure to other specified factors, initial encounter: Secondary | ICD-10-CM | POA: Diagnosis not present

## 2022-12-29 DIAGNOSIS — R293 Abnormal posture: Secondary | ICD-10-CM | POA: Diagnosis not present

## 2022-12-29 DIAGNOSIS — S42201A Unspecified fracture of upper end of right humerus, initial encounter for closed fracture: Secondary | ICD-10-CM | POA: Diagnosis not present

## 2022-12-31 DIAGNOSIS — M25611 Stiffness of right shoulder, not elsewhere classified: Secondary | ICD-10-CM | POA: Diagnosis not present

## 2022-12-31 DIAGNOSIS — R293 Abnormal posture: Secondary | ICD-10-CM | POA: Diagnosis not present

## 2022-12-31 DIAGNOSIS — Z8781 Personal history of (healed) traumatic fracture: Secondary | ICD-10-CM | POA: Diagnosis not present

## 2022-12-31 DIAGNOSIS — S42201A Unspecified fracture of upper end of right humerus, initial encounter for closed fracture: Secondary | ICD-10-CM | POA: Diagnosis not present

## 2022-12-31 DIAGNOSIS — X58XXXA Exposure to other specified factors, initial encounter: Secondary | ICD-10-CM | POA: Diagnosis not present

## 2023-01-05 DIAGNOSIS — M25611 Stiffness of right shoulder, not elsewhere classified: Secondary | ICD-10-CM | POA: Diagnosis not present

## 2023-01-05 DIAGNOSIS — R293 Abnormal posture: Secondary | ICD-10-CM | POA: Diagnosis not present

## 2023-01-05 DIAGNOSIS — X58XXXA Exposure to other specified factors, initial encounter: Secondary | ICD-10-CM | POA: Diagnosis not present

## 2023-01-05 DIAGNOSIS — S42201A Unspecified fracture of upper end of right humerus, initial encounter for closed fracture: Secondary | ICD-10-CM | POA: Diagnosis not present

## 2023-01-08 DIAGNOSIS — S42201A Unspecified fracture of upper end of right humerus, initial encounter for closed fracture: Secondary | ICD-10-CM | POA: Diagnosis not present

## 2023-01-08 DIAGNOSIS — M25611 Stiffness of right shoulder, not elsewhere classified: Secondary | ICD-10-CM | POA: Diagnosis not present

## 2023-01-08 DIAGNOSIS — X58XXXA Exposure to other specified factors, initial encounter: Secondary | ICD-10-CM | POA: Diagnosis not present

## 2023-01-08 DIAGNOSIS — R293 Abnormal posture: Secondary | ICD-10-CM | POA: Diagnosis not present

## 2023-01-12 DIAGNOSIS — M25611 Stiffness of right shoulder, not elsewhere classified: Secondary | ICD-10-CM | POA: Diagnosis not present

## 2023-01-12 DIAGNOSIS — R293 Abnormal posture: Secondary | ICD-10-CM | POA: Diagnosis not present

## 2023-01-12 DIAGNOSIS — X58XXXA Exposure to other specified factors, initial encounter: Secondary | ICD-10-CM | POA: Diagnosis not present

## 2023-01-12 DIAGNOSIS — S42201A Unspecified fracture of upper end of right humerus, initial encounter for closed fracture: Secondary | ICD-10-CM | POA: Diagnosis not present

## 2023-01-14 DIAGNOSIS — S42201A Unspecified fracture of upper end of right humerus, initial encounter for closed fracture: Secondary | ICD-10-CM | POA: Diagnosis not present

## 2023-01-14 DIAGNOSIS — R293 Abnormal posture: Secondary | ICD-10-CM | POA: Diagnosis not present

## 2023-01-14 DIAGNOSIS — X58XXXA Exposure to other specified factors, initial encounter: Secondary | ICD-10-CM | POA: Diagnosis not present

## 2023-01-14 DIAGNOSIS — M25611 Stiffness of right shoulder, not elsewhere classified: Secondary | ICD-10-CM | POA: Diagnosis not present

## 2023-01-19 DIAGNOSIS — S42201D Unspecified fracture of upper end of right humerus, subsequent encounter for fracture with routine healing: Secondary | ICD-10-CM | POA: Diagnosis not present

## 2023-01-19 DIAGNOSIS — X58XXXD Exposure to other specified factors, subsequent encounter: Secondary | ICD-10-CM | POA: Diagnosis not present

## 2023-01-19 DIAGNOSIS — M25611 Stiffness of right shoulder, not elsewhere classified: Secondary | ICD-10-CM | POA: Diagnosis not present

## 2023-01-19 DIAGNOSIS — R293 Abnormal posture: Secondary | ICD-10-CM | POA: Diagnosis not present

## 2023-01-19 DIAGNOSIS — M6281 Muscle weakness (generalized): Secondary | ICD-10-CM | POA: Diagnosis not present

## 2023-01-21 DIAGNOSIS — M25611 Stiffness of right shoulder, not elsewhere classified: Secondary | ICD-10-CM | POA: Diagnosis not present

## 2023-01-21 DIAGNOSIS — S42201D Unspecified fracture of upper end of right humerus, subsequent encounter for fracture with routine healing: Secondary | ICD-10-CM | POA: Diagnosis not present

## 2023-01-21 DIAGNOSIS — R293 Abnormal posture: Secondary | ICD-10-CM | POA: Diagnosis not present

## 2023-01-21 DIAGNOSIS — M6281 Muscle weakness (generalized): Secondary | ICD-10-CM | POA: Diagnosis not present

## 2023-01-21 DIAGNOSIS — X58XXXD Exposure to other specified factors, subsequent encounter: Secondary | ICD-10-CM | POA: Diagnosis not present

## 2023-01-26 DIAGNOSIS — M25611 Stiffness of right shoulder, not elsewhere classified: Secondary | ICD-10-CM | POA: Diagnosis not present

## 2023-01-26 DIAGNOSIS — S42201D Unspecified fracture of upper end of right humerus, subsequent encounter for fracture with routine healing: Secondary | ICD-10-CM | POA: Diagnosis not present

## 2023-01-26 DIAGNOSIS — R293 Abnormal posture: Secondary | ICD-10-CM | POA: Diagnosis not present

## 2023-01-26 DIAGNOSIS — M6281 Muscle weakness (generalized): Secondary | ICD-10-CM | POA: Diagnosis not present

## 2023-01-26 DIAGNOSIS — X58XXXD Exposure to other specified factors, subsequent encounter: Secondary | ICD-10-CM | POA: Diagnosis not present

## 2023-01-28 DIAGNOSIS — M25611 Stiffness of right shoulder, not elsewhere classified: Secondary | ICD-10-CM | POA: Diagnosis not present

## 2023-01-28 DIAGNOSIS — M6281 Muscle weakness (generalized): Secondary | ICD-10-CM | POA: Diagnosis not present

## 2023-01-28 DIAGNOSIS — S42201D Unspecified fracture of upper end of right humerus, subsequent encounter for fracture with routine healing: Secondary | ICD-10-CM | POA: Diagnosis not present

## 2023-01-28 DIAGNOSIS — X58XXXD Exposure to other specified factors, subsequent encounter: Secondary | ICD-10-CM | POA: Diagnosis not present

## 2023-01-28 DIAGNOSIS — R293 Abnormal posture: Secondary | ICD-10-CM | POA: Diagnosis not present

## 2023-02-02 DIAGNOSIS — R293 Abnormal posture: Secondary | ICD-10-CM | POA: Diagnosis not present

## 2023-02-02 DIAGNOSIS — M25611 Stiffness of right shoulder, not elsewhere classified: Secondary | ICD-10-CM | POA: Diagnosis not present

## 2023-02-02 DIAGNOSIS — S42201D Unspecified fracture of upper end of right humerus, subsequent encounter for fracture with routine healing: Secondary | ICD-10-CM | POA: Diagnosis not present

## 2023-02-02 DIAGNOSIS — X58XXXD Exposure to other specified factors, subsequent encounter: Secondary | ICD-10-CM | POA: Diagnosis not present

## 2023-02-02 DIAGNOSIS — M6281 Muscle weakness (generalized): Secondary | ICD-10-CM | POA: Diagnosis not present

## 2023-02-10 DIAGNOSIS — R293 Abnormal posture: Secondary | ICD-10-CM | POA: Diagnosis not present

## 2023-02-10 DIAGNOSIS — M25611 Stiffness of right shoulder, not elsewhere classified: Secondary | ICD-10-CM | POA: Diagnosis not present

## 2023-02-10 DIAGNOSIS — M6281 Muscle weakness (generalized): Secondary | ICD-10-CM | POA: Diagnosis not present

## 2023-02-10 DIAGNOSIS — X58XXXD Exposure to other specified factors, subsequent encounter: Secondary | ICD-10-CM | POA: Diagnosis not present

## 2023-02-10 DIAGNOSIS — S42201D Unspecified fracture of upper end of right humerus, subsequent encounter for fracture with routine healing: Secondary | ICD-10-CM | POA: Diagnosis not present

## 2023-02-19 DIAGNOSIS — S42201A Unspecified fracture of upper end of right humerus, initial encounter for closed fracture: Secondary | ICD-10-CM | POA: Diagnosis not present

## 2023-02-19 DIAGNOSIS — X58XXXD Exposure to other specified factors, subsequent encounter: Secondary | ICD-10-CM | POA: Diagnosis not present

## 2023-02-19 DIAGNOSIS — M6281 Muscle weakness (generalized): Secondary | ICD-10-CM | POA: Diagnosis not present

## 2023-02-19 DIAGNOSIS — R293 Abnormal posture: Secondary | ICD-10-CM | POA: Diagnosis not present

## 2023-02-19 DIAGNOSIS — M25611 Stiffness of right shoulder, not elsewhere classified: Secondary | ICD-10-CM | POA: Diagnosis not present

## 2023-02-19 DIAGNOSIS — S42201D Unspecified fracture of upper end of right humerus, subsequent encounter for fracture with routine healing: Secondary | ICD-10-CM | POA: Diagnosis not present

## 2023-02-25 DIAGNOSIS — R293 Abnormal posture: Secondary | ICD-10-CM | POA: Diagnosis not present

## 2023-02-25 DIAGNOSIS — M25611 Stiffness of right shoulder, not elsewhere classified: Secondary | ICD-10-CM | POA: Diagnosis not present

## 2023-02-25 DIAGNOSIS — X58XXXD Exposure to other specified factors, subsequent encounter: Secondary | ICD-10-CM | POA: Diagnosis not present

## 2023-02-25 DIAGNOSIS — S42201D Unspecified fracture of upper end of right humerus, subsequent encounter for fracture with routine healing: Secondary | ICD-10-CM | POA: Diagnosis not present

## 2023-02-25 DIAGNOSIS — M6281 Muscle weakness (generalized): Secondary | ICD-10-CM | POA: Diagnosis not present

## 2023-02-25 DIAGNOSIS — S42201A Unspecified fracture of upper end of right humerus, initial encounter for closed fracture: Secondary | ICD-10-CM | POA: Diagnosis not present

## 2023-03-01 DIAGNOSIS — Z8781 Personal history of (healed) traumatic fracture: Secondary | ICD-10-CM | POA: Diagnosis not present

## 2023-03-05 DIAGNOSIS — M6281 Muscle weakness (generalized): Secondary | ICD-10-CM | POA: Diagnosis not present

## 2023-03-05 DIAGNOSIS — M25611 Stiffness of right shoulder, not elsewhere classified: Secondary | ICD-10-CM | POA: Diagnosis not present

## 2023-03-05 DIAGNOSIS — S42201A Unspecified fracture of upper end of right humerus, initial encounter for closed fracture: Secondary | ICD-10-CM | POA: Diagnosis not present

## 2023-03-05 DIAGNOSIS — R293 Abnormal posture: Secondary | ICD-10-CM | POA: Diagnosis not present

## 2023-03-05 DIAGNOSIS — X58XXXD Exposure to other specified factors, subsequent encounter: Secondary | ICD-10-CM | POA: Diagnosis not present

## 2023-03-05 DIAGNOSIS — S42201D Unspecified fracture of upper end of right humerus, subsequent encounter for fracture with routine healing: Secondary | ICD-10-CM | POA: Diagnosis not present

## 2023-03-11 ENCOUNTER — Encounter: Payer: BLUE CROSS/BLUE SHIELD | Admitting: Family Medicine

## 2023-03-11 DIAGNOSIS — M6281 Muscle weakness (generalized): Secondary | ICD-10-CM | POA: Diagnosis not present

## 2023-03-11 DIAGNOSIS — R293 Abnormal posture: Secondary | ICD-10-CM | POA: Diagnosis not present

## 2023-03-11 DIAGNOSIS — X58XXXD Exposure to other specified factors, subsequent encounter: Secondary | ICD-10-CM | POA: Diagnosis not present

## 2023-03-11 DIAGNOSIS — S42201D Unspecified fracture of upper end of right humerus, subsequent encounter for fracture with routine healing: Secondary | ICD-10-CM | POA: Diagnosis not present

## 2023-03-11 DIAGNOSIS — M25611 Stiffness of right shoulder, not elsewhere classified: Secondary | ICD-10-CM | POA: Diagnosis not present

## 2023-03-11 DIAGNOSIS — S42201A Unspecified fracture of upper end of right humerus, initial encounter for closed fracture: Secondary | ICD-10-CM | POA: Diagnosis not present

## 2023-03-25 DIAGNOSIS — K52832 Lymphocytic colitis: Secondary | ICD-10-CM | POA: Diagnosis not present

## 2023-05-07 ENCOUNTER — Encounter: Payer: Self-pay | Admitting: Family Medicine

## 2023-05-07 ENCOUNTER — Ambulatory Visit (INDEPENDENT_AMBULATORY_CARE_PROVIDER_SITE_OTHER): Payer: BLUE CROSS/BLUE SHIELD | Admitting: Family Medicine

## 2023-05-07 VITALS — BP 100/68 | HR 51 | Temp 98.6°F | Resp 18 | Ht 62.0 in | Wt 137.0 lb

## 2023-05-07 DIAGNOSIS — E039 Hypothyroidism, unspecified: Secondary | ICD-10-CM

## 2023-05-07 DIAGNOSIS — R739 Hyperglycemia, unspecified: Secondary | ICD-10-CM | POA: Diagnosis not present

## 2023-05-07 DIAGNOSIS — Z0000 Encounter for general adult medical examination without abnormal findings: Secondary | ICD-10-CM

## 2023-05-07 DIAGNOSIS — Z13220 Encounter for screening for lipoid disorders: Secondary | ICD-10-CM | POA: Diagnosis not present

## 2023-05-07 DIAGNOSIS — I10 Essential (primary) hypertension: Secondary | ICD-10-CM

## 2023-05-07 DIAGNOSIS — I7300 Raynaud's syndrome without gangrene: Secondary | ICD-10-CM | POA: Diagnosis not present

## 2023-05-07 LAB — COMPREHENSIVE METABOLIC PANEL
ALT: 16 U/L (ref 0–35)
AST: 15 U/L (ref 0–37)
Albumin: 4.3 g/dL (ref 3.5–5.2)
Alkaline Phosphatase: 61 U/L (ref 39–117)
BUN: 13 mg/dL (ref 6–23)
CO2: 29 mEq/L (ref 19–32)
Calcium: 9.2 mg/dL (ref 8.4–10.5)
Chloride: 100 mEq/L (ref 96–112)
Creatinine, Ser: 0.9 mg/dL (ref 0.40–1.20)
GFR: 73.99 mL/min (ref >60.00)
Glucose, Bld: 95 mg/dL (ref 70–99)
Potassium: 4.2 mEq/L (ref 3.5–5.1)
Sodium: 138 mEq/L (ref 135–145)
Total Bilirubin: 0.4 mg/dL (ref 0.2–1.2)
Total Protein: 7.4 g/dL (ref 6.0–8.3)

## 2023-05-07 LAB — CBC WITH DIFFERENTIAL/PLATELET
Basophils Absolute: 0 10*3/uL (ref 0.0–0.1)
Basophils Relative: 0.6 % (ref 0.0–3.0)
Eosinophils Absolute: 0 10*3/uL (ref 0.0–0.7)
Eosinophils Relative: 0.9 % (ref 0.0–5.0)
HCT: 43.6 % (ref 36.0–46.0)
Hemoglobin: 14.5 g/dL (ref 12.0–15.0)
Lymphocytes Relative: 29.5 % (ref 12.0–46.0)
Lymphs Abs: 1.3 10*3/uL (ref 0.7–4.0)
MCHC: 33.3 g/dL (ref 30.0–36.0)
MCV: 90 fl (ref 78.0–100.0)
Monocytes Absolute: 0.3 10*3/uL (ref 0.1–1.0)
Monocytes Relative: 7.6 % (ref 3.0–12.0)
Neutro Abs: 2.7 10*3/uL (ref 1.4–7.7)
Neutrophils Relative %: 61.4 % (ref 43.0–77.0)
Platelets: 263 10*3/uL (ref 150.0–400.0)
RBC: 4.85 Mil/uL (ref 3.87–5.11)
RDW: 13.7 % (ref 11.5–15.5)
WBC: 4.4 10*3/uL (ref 4.0–10.5)

## 2023-05-07 LAB — TSH: TSH: 2.61 u[IU]/mL (ref 0.35–5.50)

## 2023-05-07 LAB — LIPID PANEL
Cholesterol: 195 mg/dL (ref 0–200)
HDL: 74.9 mg/dL (ref >39.00)
LDL Cholesterol: 112 mg/dL — ABNORMAL HIGH (ref 0–99)
NonHDL: 120.19
Total CHOL/HDL Ratio: 3
Triglycerides: 43 mg/dL (ref 0.0–149.0)
VLDL: 8.6 mg/dL (ref 0.0–40.0)

## 2023-05-07 LAB — HEMOGLOBIN A1C: Hgb A1c MFr Bld: 5.6 % (ref 4.6–6.5)

## 2023-05-07 MED ORDER — LEVOTHYROXINE SODIUM 100 MCG PO TABS: 100.0000 ug | ORAL_TABLET | Freq: Every day | ORAL | 1 refills | Status: DC

## 2023-05-07 MED ORDER — LOSARTAN POTASSIUM 50 MG PO TABS: 50.0000 mg | ORAL_TABLET | Freq: Every day | ORAL | 1 refills | Status: DC

## 2023-05-07 MED ORDER — AMLODIPINE BESYLATE 2.5 MG PO TABS: 2.5000 mg | ORAL_TABLET | Freq: Every day | ORAL | 1 refills | Status: AC

## 2023-05-07 NOTE — Assessment & Plan Note (Signed)
Ghm utd Check labs  Health Maintenance  Topic Date Due   DTaP/Tdap/Td (2 - Td or Tdap) 11/22/2016   MAMMOGRAM  04/28/2019   PAP SMEAR-Modifier  04/27/2021   Zoster Vaccines- Shingrix (1 of 2) Never done   COVID-19 Vaccine (3 - 2023-24 season) 05/23/2023 (Originally 07/17/2022)   HIV Screening  08/30/2024 (Originally 10/10/1986)   INFLUENZA VACCINE  06/17/2023   Colonoscopy  04/25/2026   Hepatitis C Screening  Completed   HPV VACCINES  Aged Out

## 2023-05-07 NOTE — Assessment & Plan Note (Signed)
Well controlled, no changes to meds. Encouraged heart healthy diet such as the DASH diet and exercise as tolerated.  °

## 2023-05-07 NOTE — Progress Notes (Signed)
Established Patient Office Visit  Subjective   Patient ID: Megan Escobar, female    DOB: 01-10-1971  Age: 53 y.o. MRN: [id number]  Chief Complaint  Patient presents with  . Annual Exam    Pt states fasting. Pap and mammo scheduled for Aug at [facility]    HPI Discussed the use of AI scribe software for clinical note transcription with the patient, who gave verbal consent to proceed.  History of Present Illness   The patient, with a history of hypertension, Raynaud's, and hypothyroidism, presents for a routine physical exam. They report a recent shoulder injury that required ORIF of the proximal humerus in December of the previous year. The shoulder is not yet fully recovered but is improving. They also have a history of colitis, which is currently manageable but not completely resolved. They have not had a shingles vaccine and are due for a tetanus shot, but have been cautious due to autoimmune issues. They have not been to the eye doctor in a long time and report difficulty with reading vision. 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      Patient Active Problem List   Diagnosis Date Noted  . Closed fracture of right proximal humerus 11/03/2022  . Preventative health care 05/22/2021  . Benign paroxysmal vertigo 05/22/2021  . Lymphocytic colitis 05/22/2021  . Loose stools 01/28/2017  . Raynaud's disease 02/19/2015  . Cardiac murmur 11/24/2013  . BP (high blood pressure) 11/24/2013  . Beat, premature ventricular 11/24/2013  . EYE FLOATERS 07/10/2010  . Essential hypertension 07/10/2010  . Hypothyroidism 08/09/2009  . GERD 07/17/2009  . GAS/BLOATING 07/17/2009  . ABDOMINAL PAIN 07/17/2009   Past Medical History:  Diagnosis Date  . HTN (hypertension)   . Hyperlipidemia   . Raynaud's disease   . Thyroid disease    Hypothyroid   Past Surgical History:  Procedure Laterality Date  . CESAREAN SECTION     x's 2  . MYOMECTOMY    . ORIF HUMERUS FRACTURE Right  10/20/2022   dr brumfield   Social History   Tobacco Use  . Smoking status: Never  . Smokeless tobacco: Never  Substance Use Topics  . Alcohol use: No  . Drug use: No   Social History   Socioeconomic History  . Marital status: Married    Spouse name: Not on file  . Number of children: Not on file  . Years of education: Not on file  . Highest education level: Not on file  Occupational History  . Occupation: rn    Associate Professor: [employer]  Tobacco Use  . Smoking status: Never  . Smokeless tobacco: Never  Substance and Sexual Activity  . Alcohol use: No  . Drug use: No  . Sexual activity: Yes  Other Topics Concern  . Not on file  Social History Narrative  . Not on file   Social Determinants of Health   Financial Resource Strain: Not on file  Food Insecurity: Not on file  Transportation Needs: Not on file  Physical Activity: Not on file  Stress: Not on file  Social Connections: Not on file  Intimate Partner Violence: Not on file   Family Status  Relation Name Status  . Mother  Alive  . Father  Alive  . Emelda Brothers  (Not Specified)  . PGM  (Not Specified)   Family History  Problem Relation Age of Onset  . Irritable bowel syndrome Mother   . Heart disease Mother 60  cabg  . Hypertension Mother   . Hyperlipidemia Mother   . Coronary artery disease Mother   . Heart disease Father        stent  . Hypertension Father   . Hyperlipidemia Father   . Coronary artery disease Father   . Breast cancer Paternal Aunt   . Breast cancer Paternal Grandmother    No Known Allergies    Review of Systems  Constitutional:  Negative for fever and malaise/fatigue.  HENT:  Negative for congestion.   Eyes:  Negative for blurred vision.  Respiratory:  Negative for shortness of breath.   Cardiovascular:  Negative for chest pain, palpitations and leg swelling.  Gastrointestinal:  Negative for abdominal pain, blood in stool and nausea.  Genitourinary:   Negative for dysuria and frequency.  Musculoskeletal:  Negative for falls.  Skin:  Negative for rash.  Neurological:  Negative for dizziness, loss of consciousness and headaches.  Endo/Heme/Allergies:  Negative for environmental allergies.  Psychiatric/Behavioral:  Negative for depression. The patient is not nervous/anxious.       Objective:     BP 100/68 (BP Location: Left Arm, Patient Position: Sitting, Cuff Size: Normal)   Pulse (!) 51   Temp 98.6 F (37 C) (Oral)   Resp 18   Ht 5\' 2"  (1.575 m)   Wt 137 lb (62.1 kg)   LMP  (LMP Unknown)   SpO2 98%   BMI 25.06 kg/m  BP Readings from Last 3 Encounters:  05/07/23 100/68  11/03/22 120/70  01/15/22 127/84   Wt Readings from Last 3 Encounters:  05/07/23 137 lb (62.1 kg)  11/03/22 138 lb (62.6 kg)  12/16/21 126 lb 12.8 oz (57.5 kg)   SpO2 Readings from Last 3 Encounters:  05/07/23 98%  11/03/22 100%  12/16/21 99%      Physical Exam Vitals and nursing note reviewed.  Constitutional:      Appearance: She is well-developed.  HENT:     Head: Normocephalic and atraumatic.     Nose: Nose normal.     Mouth/Throat:     Mouth: Mucous membranes are moist.  Eyes:     Conjunctiva/sclera: Conjunctivae normal.     Pupils: Pupils are equal, round, and reactive to light.  Neck:     Thyroid: No thyromegaly.     Vascular: No carotid bruit or JVD.  Cardiovascular:     Rate and Rhythm: Normal rate and regular rhythm.     Heart sounds: Normal heart sounds. No murmur heard. Pulmonary:     Effort: Pulmonary effort is normal. No respiratory distress.     Breath sounds: Normal breath sounds. No wheezing or rales.  Chest:     Chest wall: No tenderness.  Abdominal:     Palpations: Abdomen is soft.     Tenderness: There is no abdominal tenderness.  Musculoskeletal:        General: Normal range of motion.     Cervical back: Normal range of motion and neck supple.  Skin:    General: Skin is warm and dry.     Findings: No rash.   Neurological:     General: No focal deficit present.     Mental Status: She is alert and oriented to person, place, and time.     Gait: Gait normal.     Deep Tendon Reflexes: Reflexes normal.  Psychiatric:        Mood and Affect: Mood normal.        Behavior: Behavior normal.  Thought Content: Thought content normal.        Judgment: Judgment normal.     No results found for any visits on 05/07/23.  Last CBC Lab Results  Component Value Date   WBC 7.7 11/03/2022   HGB 12.7 11/03/2022   HCT 37.5 11/03/2022   MCV 91.1 11/03/2022   RDW 15.1 11/03/2022   PLT 363.0 11/03/2022   Last metabolic panel Lab Results  Component Value Date   GLUCOSE 98 11/03/2022   NA 137 11/03/2022   K 3.7 11/03/2022   CL 98 11/03/2022   CO2 30 11/03/2022   BUN 14 11/03/2022   CREATININE 0.84 11/03/2022   CALCIUM 9.3 11/03/2022   PROT 7.6 11/03/2022   ALBUMIN 4.5 11/03/2022   BILITOT 0.5 11/03/2022   ALKPHOS 73 11/03/2022   AST 21 11/03/2022   ALT 26 11/03/2022   Last lipids Lab Results  Component Value Date   CHOL 207 (H) 11/03/2022   HDL 82.50 11/03/2022   LDLCALC 103 (H) 11/03/2022   TRIG 106.0 11/03/2022   CHOLHDL 3 11/03/2022   Last hemoglobin A1c Lab Results  Component Value Date   HGBA1C 5.5 09/15/2016   Last thyroid functions Lab Results  Component Value Date   TSH 2.83 11/03/2022   Last vitamin D No results found for: "25OHVITD2", "25OHVITD3", "VD25OH" Last vitamin B12 and Folate No results found for: "VITAMINB12", "FOLATE"    The 10-year ASCVD risk score (Arnett DK, et al., 2019) is: 0.7%    Assessment & Plan:   Problem List Items Addressed This Visit       Unprioritized   BP (high blood pressure)   Relevant Medications   amLODipine (NORVASC) 2.5 MG tablet   losartan (COZAAR) 50 MG tablet   Raynaud's disease    Controlled --- uses norvasc in am      Relevant Medications   amLODipine (NORVASC) 2.5 MG tablet   losartan (COZAAR) 50 MG tablet    Preventative health care - Primary    Ghm utd Check labs  Health Maintenance  Topic Date Due  . DTaP/Tdap/Td (2 - Td or Tdap) 11/22/2016  . MAMMOGRAM  04/28/2019  . PAP SMEAR-Modifier  04/27/2021  . Zoster Vaccines- Shingrix (1 of 2) Never done  . COVID-19 Vaccine (3 - 2023-24 season) 05/23/2023 (Originally 07/17/2022)  . HIV Screening  08/30/2024 (Originally 10/10/1986)  . INFLUENZA VACCINE  06/17/2023  . Colonoscopy  04/25/2026  . Hepatitis C Screening  Completed  . HPV VACCINES  Aged Out        Relevant Orders   CBC with Differential/Platelet   Comprehensive metabolic panel   Lipid panel   TSH   Hypothyroidism    Check labs  Lab Results  Component Value Date   TSH 2.83 11/03/2022        Relevant Medications   levothyroxine (SYNTHROID) 100 MCG tablet   Other Relevant Orders   TSH   Essential hypertension    Well controlled, no changes to meds. Encouraged heart healthy diet such as the DASH diet and exercise as tolerated.        Relevant Medications   amLODipine (NORVASC) 2.5 MG tablet   losartan (COZAAR) 50 MG tablet   Other Relevant Orders   CBC with Differential/Platelet   Comprehensive metabolic panel   Lipid panel   Other Visit Diagnoses     Hyperglycemia       Relevant Orders   Hemoglobin A1c     Assessment and Plan    Colitis:  Symptoms are manageable but not completely resolved. Continues to take Budesonide. -Continue current management and follow-up with GI specialist, Dr. Langston Masker.  Shoulder Fracture: ORIF of proximal humerus in December of last year. Reports improvement but not yet 100%. -Continue current management and follow-up as needed.  Hypertension: Managed with Losartan. -Refill Losartan prescription.  Raynaud's Phenomenon: Managed with intermittent Amlodipine use. -Refill Amlodipine prescription.  Hypothyroidism: Managed with Levothyroxine. -Refill Levothyroxine prescription.  General Health Maintenance: -Discussed the benefits of  Shingles vaccine and Tetanus booster. Patient to consider and follow-up. -Recommended regular eye check-up due to difficulty with reading vision. -Recommended dermatology follow-up as it has been a few years since last visit. -Check A1C due to family history of diabetes and occasional fasting sugars over 100.        Return in about 6 months (around 11/06/2023) for hypertension.    Donato Schultz, DO

## 2023-05-07 NOTE — Assessment & Plan Note (Signed)
Controlled --- uses norvasc in am

## 2023-05-07 NOTE — Assessment & Plan Note (Signed)
Check labs  Lab Results  Component Value Date   TSH 2.83 11/03/2022

## 2023-07-13 DIAGNOSIS — Z1151 Encounter for screening for human papillomavirus (HPV): Secondary | ICD-10-CM | POA: Diagnosis not present

## 2023-07-13 DIAGNOSIS — M8588 Other specified disorders of bone density and structure, other site: Secondary | ICD-10-CM | POA: Diagnosis not present

## 2023-07-13 DIAGNOSIS — Z6824 Body mass index (BMI) 24.0-24.9, adult: Secondary | ICD-10-CM | POA: Diagnosis not present

## 2023-07-13 DIAGNOSIS — Z124 Encounter for screening for malignant neoplasm of cervix: Secondary | ICD-10-CM | POA: Diagnosis not present

## 2023-07-13 DIAGNOSIS — Z01419 Encounter for gynecological examination (general) (routine) without abnormal findings: Secondary | ICD-10-CM | POA: Diagnosis not present

## 2023-07-13 DIAGNOSIS — Z1231 Encounter for screening mammogram for malignant neoplasm of breast: Secondary | ICD-10-CM | POA: Diagnosis not present

## 2023-10-07 DIAGNOSIS — Z800 Family history of malignant neoplasm of digestive organs: Secondary | ICD-10-CM | POA: Diagnosis not present

## 2023-10-07 DIAGNOSIS — Z79899 Other long term (current) drug therapy: Secondary | ICD-10-CM | POA: Diagnosis not present

## 2023-10-07 DIAGNOSIS — K52832 Lymphocytic colitis: Secondary | ICD-10-CM | POA: Diagnosis not present

## 2023-12-26 ENCOUNTER — Other Ambulatory Visit: Payer: Self-pay | Admitting: Family Medicine

## 2023-12-26 DIAGNOSIS — I10 Essential (primary) hypertension: Secondary | ICD-10-CM

## 2023-12-26 DIAGNOSIS — E039 Hypothyroidism, unspecified: Secondary | ICD-10-CM

## 2024-03-25 ENCOUNTER — Other Ambulatory Visit: Payer: Self-pay | Admitting: Family Medicine

## 2024-03-25 DIAGNOSIS — E039 Hypothyroidism, unspecified: Secondary | ICD-10-CM

## 2024-03-25 DIAGNOSIS — I10 Essential (primary) hypertension: Secondary | ICD-10-CM

## 2024-03-27 ENCOUNTER — Other Ambulatory Visit: Payer: Self-pay | Admitting: Family Medicine

## 2024-03-27 DIAGNOSIS — E039 Hypothyroidism, unspecified: Secondary | ICD-10-CM

## 2024-03-27 DIAGNOSIS — I10 Essential (primary) hypertension: Secondary | ICD-10-CM

## 2024-03-31 DIAGNOSIS — K52832 Lymphocytic colitis: Secondary | ICD-10-CM | POA: Diagnosis not present

## 2024-04-09 ENCOUNTER — Ambulatory Visit
Admission: RE | Admit: 2024-04-09 | Discharge: 2024-04-09 | Disposition: A | Discharge status: Home / Self Care | Source: Ambulatory Visit | Attending: Emergency Medicine

## 2024-04-09 ENCOUNTER — Other Ambulatory Visit: Payer: Self-pay

## 2024-04-09 VITALS — BP 158/90 | HR 62 | Temp 99.3°F | Resp 16

## 2024-04-09 DIAGNOSIS — H00011 Hordeolum externum right upper eyelid: Secondary | ICD-10-CM | POA: Diagnosis not present

## 2024-04-09 MED ORDER — ERYTHROMYCIN 5 MG/GM OP OINT: TOPICAL_OINTMENT | OPHTHALMIC | 0 refills | Status: DC

## 2024-04-09 NOTE — Discharge Instructions (Addendum)
 Continue with warm compresses.  Apply the erythromycin ointment to the lower eyelid 4 times daily for the next 5 days to help prevent bacterial infection and to help coat and soothe the eye.  Return to clinic or follow-up with your primary care provider if no improvement or any changes.

## 2024-04-09 NOTE — ED Triage Notes (Signed)
 Right eyelid infection, has been draining - since Wednesday. No otc meds, has been using warm compresses.

## 2024-04-09 NOTE — ED Provider Notes (Signed)
 [facility]    CSN: [id number] Arrival date & time: 04/09/24  4098      History   Chief Complaint Chief Complaint  Patient presents with   Eye Problem    HPI Megan Escobar is a 53 y.o. female.   Patient presents to clinic over concern of right upper eyelid redness, swelling and drainage that started this morning.  She has been using warm compresses to the eyelid since Wednesday and the area just started to drain today.  Eyelid is warm and swollen.  She did wear make-up prior to having the eye irritation.  Has not had any vision changes.  Denies eye pain or pain with eye movement.  Has not had fevers.  The history is provided by the patient and medical records.  Eye Problem   Past Medical History:  Diagnosis Date   HTN (hypertension)    Hyperlipidemia    Raynaud's disease    Thyroid  disease    Hypothyroid    Patient Active Problem List   Diagnosis Date Noted   Closed fracture of right proximal humerus 11/03/2022   Preventative health care 05/22/2021   Benign paroxysmal vertigo 05/22/2021   Lymphocytic colitis 05/22/2021   Loose stools 01/28/2017   Raynaud's disease 02/19/2015   Cardiac murmur 11/24/2013   BP (high blood pressure) 11/24/2013   Beat, premature ventricular 11/24/2013   EYE FLOATERS 07/10/2010   Essential hypertension 07/10/2010   Hypothyroidism 08/09/2009   GERD 07/17/2009   GAS/BLOATING 07/17/2009   ABDOMINAL PAIN 07/17/2009    Past Surgical History:  Procedure Laterality Date   CESAREAN SECTION     x's 2   MYOMECTOMY     ORIF HUMERUS FRACTURE Right 10/20/2022   dr brumfield    OB History   No obstetric history on file.      Home Medications    Prior to Admission medications   Medication Sig Start Date End Date Taking? Authorizing Provider  erythromycin ophthalmic ointment Place a 1/2 inch ribbon of ointment into the lower eyelid 4x daily for 5 days 04/09/24  Yes Holle Sprick  N, FNP  amLODipine  (NORVASC ) 2.5  MG tablet Take 1 tablet (2.5 mg total) by mouth daily. 05/07/23   Roel Clarity R, DO  budesonide (ENTOCORT EC) 3 MG 24 hr capsule 3 capsules 05/23/21   [provider]  dicyclomine (BENTYL) 10 MG capsule Take by mouth. 11/25/21   [provider]  levothyroxine  (SYNTHROID ) 100 MCG tablet Take 1 tablet (100 mcg total) by mouth daily before breakfast. Pt needs office visit for further refills 03/27/24   Crecencio Dodge, Adel Holt R, DO  losartan  (COZAAR ) 50 MG tablet Take 1 tablet (50 mg total) by mouth daily. Pt needs office visit for further refills 03/27/24   Crecencio Dodge, Candida Chalk, DO    Family History Family History  Problem Relation Age of Onset   Irritable bowel syndrome Mother    Heart disease Mother 28       cabg   Hypertension Mother    Hyperlipidemia Mother    Coronary artery disease Mother    Heart disease Father        stent   Hypertension Father    Hyperlipidemia Father    Coronary artery disease Father    Breast cancer Paternal Aunt    Breast cancer Paternal Grandmother     Social History Social History   Tobacco Use   Smoking status: Never   Smokeless tobacco: Never  Substance Use Topics  Alcohol use: No   Drug use: No     Allergies   Patient has no known allergies.   Review of Systems Review of Systems  Per HPI  Physical Exam Triage Vital Signs ED Triage Vitals  Encounter Vitals Group     BP 04/09/24 0841 (!) 158/90     Systolic BP Percentile --      Diastolic BP Percentile --      Pulse Rate 04/09/24 0841 62     Resp 04/09/24 0841 16     Temp 04/09/24 0841 99.3 F (37.4 C)     Temp Source 04/09/24 0841 Oral     SpO2 04/09/24 0841 98 %     Weight --      Height --      Head Circumference --      Peak Flow --      Pain Score 04/09/24 0844 2     Pain Loc --      Pain Education --      Exclude from Growth Chart --    No data found.  Updated Vital Signs BP (!) 158/90   Pulse 62   Temp 99.3 F (37.4 C) (Oral)   Resp 16    LMP  (LMP Unknown)   SpO2 98%   Visual Acuity Right Eye Distance:   Left Eye Distance:   Bilateral Distance:    Right Eye Near:   Left Eye Near:    Bilateral Near:     Physical Exam Vitals and nursing note reviewed.  Constitutional:      Appearance: Normal appearance.  HENT:     Head: Normocephalic and atraumatic.     Right Ear: External ear normal.     Left Ear: External ear normal.     Nose: Nose normal.     Mouth/Throat:     Mouth: Mucous membranes are moist.  Eyes:     General:        Right eye: Discharge and hordeolum present.     Extraocular Movements: Extraocular movements intact.     Conjunctiva/sclera: Conjunctivae normal.     Right eye: Right conjunctiva is not injected.     Pupils: Pupils are equal, round, and reactive to light.   Cardiovascular:     Rate and Rhythm: Normal rate.  Pulmonary:     Effort: Pulmonary effort is normal.  Skin:    General: Skin is warm and dry.  Neurological:     General: No focal deficit present.     Mental Status: She is alert.  Psychiatric:        Mood and Affect: Mood normal.      UC Treatments / Results  Labs (all labs ordered are listed, but only abnormal results are displayed) Labs Reviewed - No data to display  EKG   Radiology No results found.  Procedures Procedures (including critical care time)  Medications Ordered in UC Medications - No data to display  Initial Impression / Assessment and Plan / UC Course  I have reviewed the triage vital signs and the nursing notes.  Pertinent labs & imaging results that were available during my care of the patient were reviewed by me and considered in my medical decision making (see chart for details).  Vitals in triage reviewed, patient is hemodynamically stable.  PERRLA.  Stye to right upper eyelid that is actively draining.  Mild upper eyelid erythema.  Without orbital pain, low concern for orbital cellulitis at this time or preseptal cellulitis.  Advised  erythromycin ointment and warm compresses to help sooth and further drain the eye.  Plan of care, follow-up care return precautions given, no questions at this time.     Final Clinical Impressions(s) / UC Diagnoses   Final diagnoses:  Hordeolum externum of right upper eyelid     Discharge Instructions      Continue with warm compresses.  Apply the erythromycin ointment to the lower eyelid 4 times daily for the next 5 days to help prevent bacterial infection and to help coat and soothe the eye.  Return to clinic or follow-up with your primary care provider if no improvement or any changes.  ED Prescriptions     Medication Sig Dispense Auth. Provider   erythromycin ophthalmic ointment Place a 1/2 inch ribbon of ointment into the lower eyelid 4x daily for 5 days 3.5 g Harlow Lighter, Cyrus Ramsburg  N, FNP      PDMP not reviewed this encounter.   Harlow Lighter, Ruhan Borak  N, FNP 04/09/24 [phone]

## 2024-05-07 DIAGNOSIS — H00014 Hordeolum externum left upper eyelid: Secondary | ICD-10-CM | POA: Diagnosis not present

## 2024-05-07 DIAGNOSIS — L03213 Periorbital cellulitis: Secondary | ICD-10-CM | POA: Diagnosis not present

## 2024-05-10 DIAGNOSIS — H00024 Hordeolum internum left upper eyelid: Secondary | ICD-10-CM | POA: Diagnosis not present

## 2024-05-11 ENCOUNTER — Encounter: Admitting: Family Medicine

## 2024-06-13 ENCOUNTER — Encounter: Admitting: Family Medicine

## 2024-06-23 ENCOUNTER — Other Ambulatory Visit: Payer: Self-pay | Admitting: Family Medicine

## 2024-06-23 DIAGNOSIS — I10 Essential (primary) hypertension: Secondary | ICD-10-CM

## 2024-06-23 DIAGNOSIS — E039 Hypothyroidism, unspecified: Secondary | ICD-10-CM

## 2024-06-30 ENCOUNTER — Ambulatory Visit (INDEPENDENT_AMBULATORY_CARE_PROVIDER_SITE_OTHER): Admitting: Family Medicine

## 2024-06-30 ENCOUNTER — Encounter: Payer: Self-pay | Admitting: Family Medicine

## 2024-06-30 VITALS — BP 130/80 | HR 53 | Temp 98.0°F | Resp 16 | Ht 62.0 in | Wt 138.4 lb

## 2024-06-30 DIAGNOSIS — I7300 Raynaud's syndrome without gangrene: Secondary | ICD-10-CM | POA: Diagnosis not present

## 2024-06-30 DIAGNOSIS — I10 Essential (primary) hypertension: Secondary | ICD-10-CM

## 2024-06-30 DIAGNOSIS — R739 Hyperglycemia, unspecified: Secondary | ICD-10-CM

## 2024-06-30 DIAGNOSIS — E039 Hypothyroidism, unspecified: Secondary | ICD-10-CM

## 2024-06-30 DIAGNOSIS — H00014 Hordeolum externum left upper eyelid: Secondary | ICD-10-CM

## 2024-06-30 DIAGNOSIS — Z0000 Encounter for general adult medical examination without abnormal findings: Secondary | ICD-10-CM | POA: Diagnosis not present

## 2024-06-30 LAB — LIPID PANEL
Cholesterol: 207 mg/dL — ABNORMAL HIGH (ref 0–200)
HDL: 86.5 mg/dL (ref >39.00)
LDL Cholesterol: 110 mg/dL — ABNORMAL HIGH (ref 0–99)
NonHDL: 120.16
Total CHOL/HDL Ratio: 2
Triglycerides: 49 mg/dL (ref 0.0–149.0)
VLDL: 9.8 mg/dL (ref 0.0–40.0)

## 2024-06-30 LAB — CBC WITH DIFFERENTIAL/PLATELET
Basophils Absolute: 0 K/uL (ref 0.0–0.1)
Basophils Relative: 0.5 % (ref 0.0–3.0)
Eosinophils Absolute: 0 K/uL (ref 0.0–0.7)
Eosinophils Relative: 0.9 % (ref 0.0–5.0)
HCT: 42 % (ref 36.0–46.0)
Hemoglobin: 13.9 g/dL (ref 12.0–15.0)
Lymphocytes Relative: 29 % (ref 12.0–46.0)
Lymphs Abs: 1.3 K/uL (ref 0.7–4.0)
MCHC: 33.1 g/dL (ref 30.0–36.0)
MCV: 89.3 fl (ref 78.0–100.0)
Monocytes Absolute: 0.4 K/uL (ref 0.1–1.0)
Monocytes Relative: 8.1 % (ref 3.0–12.0)
Neutro Abs: 2.8 K/uL (ref 1.4–7.7)
Neutrophils Relative %: 61.5 % (ref 43.0–77.0)
Platelets: 246 K/uL (ref 150.0–400.0)
RBC: 4.71 Mil/uL (ref 3.87–5.11)
RDW: 13.5 % (ref 11.5–15.5)
WBC: 4.5 K/uL (ref 4.0–10.5)

## 2024-06-30 LAB — COMPREHENSIVE METABOLIC PANEL WITH GFR
ALT: 17 U/L (ref 0–35)
AST: 16 U/L (ref 0–37)
Albumin: 4.3 g/dL (ref 3.5–5.2)
Alkaline Phosphatase: 60 U/L (ref 39–117)
BUN: 10 mg/dL (ref 6–23)
CO2: 29 meq/L (ref 19–32)
Calcium: 8.9 mg/dL (ref 8.4–10.5)
Chloride: 102 meq/L (ref 96–112)
Creatinine, Ser: 0.79 mg/dL (ref 0.40–1.20)
GFR: 85.82 mL/min (ref >60.00)
Glucose, Bld: 94 mg/dL (ref 70–99)
Potassium: 4 meq/L (ref 3.5–5.1)
Sodium: 138 meq/L (ref 135–145)
Total Bilirubin: 0.4 mg/dL (ref 0.2–1.2)
Total Protein: 7 g/dL (ref 6.0–8.3)

## 2024-06-30 LAB — TSH: TSH: 0.6 u[IU]/mL (ref 0.35–5.50)

## 2024-06-30 MED ORDER — ERYTHROMYCIN 5 MG/GM OP OINT: 1.0000 | TOPICAL_OINTMENT | Freq: Three times a day (TID) | OPHTHALMIC | 1 refills | Status: AC

## 2024-06-30 MED ORDER — LOSARTAN POTASSIUM 50 MG PO TABS: 50.0000 mg | ORAL_TABLET | Freq: Every day | ORAL | 1 refills | Status: AC

## 2024-06-30 MED ORDER — LEVOTHYROXINE SODIUM 100 MCG PO TABS: 100.0000 ug | ORAL_TABLET | Freq: Every day | ORAL | 3 refills | Status: AC

## 2024-06-30 NOTE — Progress Notes (Addendum)
 Subjective:    Patient ID: Megan Escobar, female    DOB: [birth year], 53 y.o.   MRN: [id number]  Chief Complaint  Patient presents with  . Annual Exam    Pt states fasting     HPI Patient is in today for cpe.  Discussed the use of AI scribe software for clinical note transcription with the patient, who gave verbal consent to proceed.   History of Present Illness Megan Escobar is a 54 year old female who presents for an annual physical exam.  She has a persistent sty in her left eye since June, treated with oral antibiotics twice. Initially, it caused significant swelling and drainage, but while the swelling subsided, it has not completely resolved and occasionally flares up. She has not worn eye makeup since June to avoid aggravating the condition.  She has a history of lymphocytic colitis and routinely sees a gastroenterologist for management. Her previous gastroenterologist retired, and she is transitioning to a new provider within the same group.  She experiences cold hands, particularly in the winter, but does not report significant symptoms such as color changes or constriction at this time of year. Her brother-in-law also has similar symptoms and uses battery-operated gloves and socks during winter.  Her current medications include losartan  and levothyroxine . She has not received the shingles vaccine yet. She has not received a tetanus shot in a long time. She has an upcoming gynecological appointment in October and routinely gets mammograms and Pap smears at her gynecologist's office. She receives her flu shot at work.  No new moles or joint issues. No changes in family history or new surgeries.   Past Medical History:  Diagnosis Date  . HTN (hypertension)   . Hyperlipidemia   . Raynaud's disease   . Thyroid  disease    Hypothyroid    Past Surgical History:  Procedure Laterality Date  . CESAREAN SECTION     x's 2  . MYOMECTOMY    . ORIF HUMERUS FRACTURE Right  10/20/2022   dr brumfield    Family History  Problem Relation Age of Onset  . Irritable bowel syndrome Mother   . Heart disease Mother 50       cabg  . Hypertension Mother   . Hyperlipidemia Mother   . Coronary artery disease Mother   . Heart disease Father        stent  . Hypertension Father   . Hyperlipidemia Father   . Coronary artery disease Father   . Breast cancer Paternal Aunt   . Breast cancer Paternal Grandmother     Social History   Socioeconomic History  . Marital status: Married    Spouse name: Not on file  . Number of children: Not on file  . Years of education: Not on file  . Highest education level: Bachelor's degree (e.g., BA, AB, BS)  Occupational History  . Occupation: rn    Associate Professor: [employer]  Tobacco Use  . Smoking status: Never  . Smokeless tobacco: Never  Substance and Sexual Activity  . Alcohol use: No  . Drug use: No  . Sexual activity: Yes    Partners: Male  Other Topics Concern  . Not on file  Social History Narrative  . Not on file   Social Drivers of Health   Financial Resource Strain: Low Risk  (06/29/2024)   Overall Financial Resource Strain (CARDIA)   . Difficulty of Paying Living Expenses: Not hard at all  Food Insecurity: No  Food Insecurity (06/29/2024)   Hunger Vital Sign   . Worried About Programme researcher, broadcasting/film/video in the Last Year: Never true   . Ran Out of Food in the Last Year: Never true  Transportation Needs: No Transportation Needs (06/29/2024)   PRAPARE - Transportation   . Lack of Transportation (Medical): No   . Lack of Transportation (Non-Medical): No  Physical Activity: Insufficiently Active (06/29/2024)   Exercise Vital Sign   . Days of Exercise per Week: 2 days   . Minutes of Exercise per Session: 30 min  Stress: No Stress Concern Present (06/29/2024)   [employer] of Occupational Health - Occupational Stress Questionnaire   . Feeling of Stress: Not at all  Social Connections: Socially  Integrated (06/29/2024)   Social Connection and Isolation Panel   . Frequency of Communication with Friends and Family: More than three times a week   . Frequency of Social Gatherings with Friends and Family: Three times a week   . Attends Religious Services: 1 to 4 times per year   . Active Member of Clubs or Organizations: Yes   . Attends Banker Meetings: 1 to 4 times per year   . Marital Status: Married  Catering manager Violence: Not At Risk (11/22/2022)   Received from [facility]   HITS   . Over the last 12 months how often did your partner physically hurt you?: Never   . Over the last 12 months how often did your partner insult you or talk down to you?: Never   . Over the last 12 months how often did your partner threaten you with physical harm?: Never   . Over the last 12 months how often did your partner scream or curse at you?: Never    Outpatient Medications Prior to Visit  Medication Sig Dispense Refill  . amLODipine  (NORVASC ) 2.5 MG tablet Take 1 tablet (2.5 mg total) by mouth daily. 90 tablet 1  . budesonide (ENTOCORT EC) 3 MG 24 hr capsule 3 capsules    . dicyclomine (BENTYL) 10 MG capsule Take by mouth.    . erythromycin  ophthalmic ointment Place a 1/2 inch ribbon of ointment into the lower eyelid 4x daily for 5 days 3.5 g 0  . levothyroxine  (SYNTHROID ) 100 MCG tablet TAKE 1 TABLET BY MOUTH ONCE DAILY BEFORE BREAKFAST . APPOINTMENT REQUIRED FOR FUTURE REFILLS 30 tablet 0  . losartan  (COZAAR ) 50 MG tablet TAKE 1 TABLET BY MOUTH ONCE DAILY . APPOINTMENT REQUIRED FOR FUTURE REFILLS 30 tablet 0  . colestipol (COLESTID) 1 g tablet Take 1 g by mouth daily.     Facility-Administered Medications Prior to Visit  Medication Dose Route Frequency Provider Last Rate Last Admin  . hyoscyamine  (LEVSIN  SL) SL tablet 0.125 mg  0.125 mg Oral Q4H PRN Lowne Chase, Angellina Ferdinand R, DO        No Known Allergies  Review of Systems  Constitutional:  Negative for chills, fever and  malaise/fatigue.  HENT:  Negative for congestion and hearing loss.   Eyes:  Negative for discharge.  Respiratory:  Negative for cough, sputum production and shortness of breath.   Cardiovascular:  Negative for chest pain, palpitations and leg swelling.  Gastrointestinal:  Negative for abdominal pain, blood in stool, constipation, diarrhea, heartburn, nausea and vomiting.  Genitourinary:  Negative for dysuria, frequency, hematuria and urgency.  Musculoskeletal:  Negative for back pain, falls and myalgias.  Skin:  Negative for rash.  Neurological:  Negative for dizziness, sensory change, loss of  consciousness, weakness and headaches.  Endo/Heme/Allergies:  Negative for environmental allergies. Does not bruise/bleed easily.  Psychiatric/Behavioral:  Negative for depression and suicidal ideas. The patient is not nervous/anxious and does not have insomnia.        Objective:    Physical Exam Vitals and nursing note reviewed.  Constitutional:      General: She is not in acute distress.    Appearance: Normal appearance. She is well-developed.  HENT:     Head: Normocephalic and atraumatic.     Right Ear: Tympanic membrane, ear canal and external ear normal. There is no impacted cerumen.     Left Ear: Tympanic membrane, ear canal and external ear normal. There is no impacted cerumen.     Nose: Nose normal.     Mouth/Throat:     Mouth: Mucous membranes are moist.     Pharynx: Oropharynx is clear. No oropharyngeal exudate or posterior oropharyngeal erythema.  Eyes:     General: No scleral icterus.       Right eye: No discharge.        Left eye: Hordeolum present.No discharge.     Conjunctiva/sclera: Conjunctivae normal.     Pupils: Pupils are equal, round, and reactive to light.  Neck:     Thyroid : No thyromegaly or thyroid  tenderness.     Vascular: No JVD.  Cardiovascular:     Rate and Rhythm: Normal rate and regular rhythm.     Heart sounds: Normal heart sounds. No murmur  heard. Pulmonary:     Effort: Pulmonary effort is normal. No respiratory distress.     Breath sounds: Normal breath sounds.  Abdominal:     General: Bowel sounds are normal. There is no distension.     Palpations: Abdomen is soft. There is no mass.     Tenderness: There is no abdominal tenderness. There is no guarding or rebound.  Musculoskeletal:        General: Normal range of motion.     Cervical back: Normal range of motion and neck supple.     Right lower leg: No edema.     Left lower leg: No edema.  Lymphadenopathy:     Cervical: No cervical adenopathy.  Skin:    General: Skin is warm and dry.     Findings: No erythema or rash.  Neurological:     Mental Status: She is alert and oriented to person, place, and time.     Cranial Nerves: No cranial nerve deficit.     Deep Tendon Reflexes: Reflexes are normal and symmetric.  Psychiatric:        Mood and Affect: Mood normal.        Behavior: Behavior normal.        Thought Content: Thought content normal.        Judgment: Judgment normal.     BP 130/80 (BP Location: Right Arm, Patient Position: Sitting, Cuff Size: Normal)   Pulse (!) 53   Temp 98 F (36.7 C) (Oral)   Resp 16   Ht 5' 2 (1.575 m)   Wt 138 lb 6.4 oz (62.8 kg)   LMP  (LMP Unknown)   SpO2 99%   BMI 25.31 kg/m  Wt Readings from Last 3 Encounters:  06/30/24 138 lb 6.4 oz (62.8 kg)  05/07/23 137 lb (62.1 kg)  11/03/22 138 lb (62.6 kg)    Diabetic Foot Exam - Simple   No data filed    Lab Results  Component Value Date   WBC 4.4  05/07/2023   HGB 14.5 05/07/2023   HCT 43.6 05/07/2023   PLT 263.0 05/07/2023   GLUCOSE 95 05/07/2023   CHOL 195 05/07/2023   TRIG 43.0 05/07/2023   HDL 74.90 05/07/2023   LDLCALC 112 (H) 05/07/2023   ALT 16 05/07/2023   AST 15 05/07/2023   NA 138 05/07/2023   K 4.2 05/07/2023   CL 100 05/07/2023   CREATININE 0.90 05/07/2023   BUN 13 05/07/2023   CO2 29 05/07/2023   TSH 2.61 05/07/2023   HGBA1C 5.6 05/07/2023     Lab Results  Component Value Date   TSH 2.61 05/07/2023   Lab Results  Component Value Date   WBC 4.4 05/07/2023   HGB 14.5 05/07/2023   HCT 43.6 05/07/2023   MCV 90.0 05/07/2023   PLT 263.0 05/07/2023   Lab Results  Component Value Date   NA 138 05/07/2023   K 4.2 05/07/2023   CO2 29 05/07/2023   GLUCOSE 95 05/07/2023   BUN 13 05/07/2023   CREATININE 0.90 05/07/2023   BILITOT 0.4 05/07/2023   ALKPHOS 61 05/07/2023   AST 15 05/07/2023   ALT 16 05/07/2023   PROT 7.4 05/07/2023   ALBUMIN 4.3 05/07/2023   CALCIUM 9.2 05/07/2023   GFR 73.99 05/07/2023   Lab Results  Component Value Date   CHOL 195 05/07/2023   Lab Results  Component Value Date   HDL 74.90 05/07/2023   Lab Results  Component Value Date   LDLCALC 112 (H) 05/07/2023   Lab Results  Component Value Date   TRIG 43.0 05/07/2023   Lab Results  Component Value Date   CHOLHDL 3 05/07/2023   Lab Results  Component Value Date   HGBA1C 5.6 05/07/2023       Assessment & Plan:  Preventative health care Assessment & Plan: Ghm utd Check labs  See AVS Health Maintenance  Topic Date Due  . Hepatitis B Vaccines 19-59 Average Risk (1 of 3 - 19+ 3-dose series) Never done  . Zoster Vaccines- Shingrix (1 of 2) Never done  . DTaP/Tdap/Td (2 - Td or Tdap) 11/22/2016  . MAMMOGRAM  04/28/2019  . COVID-19 Vaccine (3 - Pfizer risk series) 12/27/2019  . Cervical Cancer Screening (HPV/Pap Cotest)  04/27/2021  . INFLUENZA VACCINE  06/16/2024  . HIV Screening  08/30/2024 (Originally [birth year]1986)  . Pneumococcal Vaccine: 50+ Years (1 of 1 - PCV) 06/30/2025 (Originally 10/10/2021)  . Colonoscopy  04/25/2026  . Hepatitis C Screening  Completed  . HPV VACCINES  Aged Out  . Meningococcal B Vaccine  Aged Out     Orders: -     CBC with Differential/Platelet -     Comprehensive metabolic panel with GFR -     Lipid panel -     TSH  Primary hypertension -     Losartan  Potassium; Take 1 tablet (50 mg total)  by mouth daily.  Dispense: 90 tablet; Refill: 1  Hypothyroidism, unspecified type -     Levothyroxine  Sodium; Take 1 tablet (100 mcg total) by mouth daily before breakfast.  Dispense: 90 tablet; Refill: 3 -     TSH  Essential hypertension -     CBC with Differential/Platelet -     Comprehensive metabolic panel with GFR -     Lipid panel  Raynaud's disease without gangrene  Hyperglycemia -     Comprehensive metabolic panel with GFR  Hordeolum externum of left upper eyelid -     Ambulatory referral to Ophthalmology -  Erythromycin ; Place 1 Application into the left eye 3 (three) times daily.  Dispense: 3.5 g; Refill: 1  Assessment and Plan Assessment & Plan Adult Wellness Visit   Routine adult wellness visit with well-managed blood pressure. She is due for a tetanus vaccine but has chosen to defer it. Mammogram and Pap smear are managed by her gynecologist. She is considering the shingles vaccine, which is 95% effective and not live, reducing the risk of shingles and associated complications. No new changes in family history or surgeries. Continue routine follow-up with gynecologist for mammogram and Pap smear. Order basic labs including cholesterol, sugar, liver, kidney, thyroid , and hemoglobin.  Chalazion of left eyelid, recurrent   Recurrent chalazion of the left eyelid since June with intermittent flare-ups. Warm compresses and eyelid hygiene have been recommended. Referral to an ophthalmologist is necessary due to persistent symptoms and difficulty in obtaining timely appointments. Prescribe antibiotic ointment for topical application and refer to an ophthalmologist for further evaluation and management.  Essential hypertension   Essential hypertension is well-managed with losartan . Continue losartan  for blood pressure management.  Hypothyroidism   Hypothyroidism is managed with levothyroxine . Continue levothyroxine  for thyroid  management.    Dezirea Mccollister R Lowne Chase, DO

## 2024-06-30 NOTE — Assessment & Plan Note (Signed)
 Ghm utd Check labs  See AVS Health Maintenance  Topic Date Due   Hepatitis B Vaccines 19-59 Average Risk (1 of 3 - 19+ 3-dose series) Never done   Zoster Vaccines- Shingrix (1 of 2) Never done   DTaP/Tdap/Td (2 - Td or Tdap) 11/22/2016   MAMMOGRAM  04/28/2019   COVID-19 Vaccine (3 - Pfizer risk series) 12/27/2019   Cervical Cancer Screening (HPV/Pap Cotest)  04/27/2021   INFLUENZA VACCINE  06/16/2024   HIV Screening  08/30/2024 (Originally [birth year]1986)   Pneumococcal Vaccine: 50+ Years (1 of 1 - PCV) 06/30/2025 (Originally 10/10/2021)   Colonoscopy  04/25/2026   Hepatitis C Screening  Completed   HPV VACCINES  Aged Out   Meningococcal B Vaccine  Aged Out

## 2024-07-05 ENCOUNTER — Ambulatory Visit: Payer: Self-pay | Admitting: Family Medicine

## 2024-07-26 DIAGNOSIS — H0288A Meibomian gland dysfunction right eye, upper and lower eyelids: Secondary | ICD-10-CM | POA: Diagnosis not present

## 2024-07-26 DIAGNOSIS — H0014 Chalazion left upper eyelid: Secondary | ICD-10-CM | POA: Diagnosis not present

## 2024-07-26 DIAGNOSIS — H0288B Meibomian gland dysfunction left eye, upper and lower eyelids: Secondary | ICD-10-CM | POA: Diagnosis not present

## 2024-08-17 DIAGNOSIS — R197 Diarrhea, unspecified: Secondary | ICD-10-CM | POA: Diagnosis not present

## 2024-08-17 DIAGNOSIS — K52832 Lymphocytic colitis: Secondary | ICD-10-CM | POA: Diagnosis not present

## 2024-10-25 DIAGNOSIS — H0288A Meibomian gland dysfunction right eye, upper and lower eyelids: Secondary | ICD-10-CM | POA: Diagnosis not present

## 2024-10-25 DIAGNOSIS — H0288B Meibomian gland dysfunction left eye, upper and lower eyelids: Secondary | ICD-10-CM | POA: Diagnosis not present

## 2024-10-25 DIAGNOSIS — H0014 Chalazion left upper eyelid: Secondary | ICD-10-CM | POA: Diagnosis not present
